# Patient Record
Sex: Male | Born: 1955 | Race: White | Hispanic: No | Marital: Married | Smoking: Former smoker
Health system: Southern US, Community
[De-identification: ages and names within clinical notes are randomized; demographics above are authoritative.]

## PROBLEM LIST (undated history)

## (undated) DIAGNOSIS — G5601 Carpal tunnel syndrome, right upper limb: Secondary | ICD-10-CM

## (undated) DIAGNOSIS — I82409 Acute embolism and thrombosis of unspecified deep veins of unspecified lower extremity: Secondary | ICD-10-CM

## (undated) DIAGNOSIS — M797 Fibromyalgia: Secondary | ICD-10-CM

## (undated) DIAGNOSIS — I2699 Other pulmonary embolism without acute cor pulmonale: Secondary | ICD-10-CM

## (undated) DIAGNOSIS — Z87442 Personal history of urinary calculi: Secondary | ICD-10-CM

## (undated) DIAGNOSIS — K219 Gastro-esophageal reflux disease without esophagitis: Secondary | ICD-10-CM

## (undated) DIAGNOSIS — J189 Pneumonia, unspecified organism: Secondary | ICD-10-CM

## (undated) DIAGNOSIS — G4700 Insomnia, unspecified: Secondary | ICD-10-CM

## (undated) DIAGNOSIS — K589 Irritable bowel syndrome without diarrhea: Secondary | ICD-10-CM

## (undated) DIAGNOSIS — Z8719 Personal history of other diseases of the digestive system: Secondary | ICD-10-CM

## (undated) DIAGNOSIS — G43909 Migraine, unspecified, not intractable, without status migrainosus: Secondary | ICD-10-CM

## (undated) HISTORY — PX: KNEE SURGERY: SHX244

## (undated) HISTORY — PX: LITHOTRIPSY: SUR834

## (undated) HISTORY — PX: BACK SURGERY: SHX140

## (undated) HISTORY — PX: CARPAL TUNNEL RELEASE: SHX101

## (undated) HISTORY — PX: FOREARM SURGERY: SHX651

---

## 2000-09-01 ENCOUNTER — Encounter: Payer: Self-pay | Admitting: Emergency Medicine

## 2000-09-01 ENCOUNTER — Emergency Department (HOSPITAL_COMMUNITY): Admission: EM | Admit: 2000-09-01 | Discharge: 2000-09-01 | Payer: Self-pay | Admitting: Emergency Medicine

## 2000-10-02 ENCOUNTER — Encounter: Payer: Self-pay | Admitting: Urology

## 2000-10-02 ENCOUNTER — Encounter: Admission: RE | Admit: 2000-10-02 | Discharge: 2000-10-02 | Payer: Self-pay | Admitting: Urology

## 2004-08-11 ENCOUNTER — Emergency Department (HOSPITAL_COMMUNITY): Admission: EM | Admit: 2004-08-11 | Discharge: 2004-08-11 | Payer: Self-pay | Admitting: Emergency Medicine

## 2004-08-20 ENCOUNTER — Emergency Department (HOSPITAL_COMMUNITY): Admission: EM | Admit: 2004-08-20 | Discharge: 2004-08-20 | Payer: Self-pay | Admitting: *Deleted

## 2005-12-14 ENCOUNTER — Emergency Department (HOSPITAL_COMMUNITY): Admission: EM | Admit: 2005-12-14 | Discharge: 2005-12-14 | Payer: Self-pay | Admitting: Emergency Medicine

## 2006-01-23 ENCOUNTER — Encounter: Admission: RE | Admit: 2006-01-23 | Discharge: 2006-01-23 | Payer: Self-pay | Admitting: Gastroenterology

## 2006-12-03 IMAGING — NM NM HEPATO W/GB/PHARM/[PERSON_NAME]
5 series · 10 of 10 positions shown · non-contrast
Comparison: None.

CLINICAL DATA: Upper abdominal pain. 
 NUCLEAR MEDICINE HEPATOBILIARY SCAN WITH EJECTION FRACTION:
TECHNIQUE: Sequential abdominal images were obtained following intravenous injection of radiopharmaceutical.  Sequential images were continued following oral ingestion of 8 oz. half-and-half, and the gallbladder ejection fraction was calculated.
 Radiopharmaceutical:  5.1 mCi Tc 99m Choletec

[gb hepatobiliary · 1 of 1 slices shown (1 of 5)]
[im 1/1]
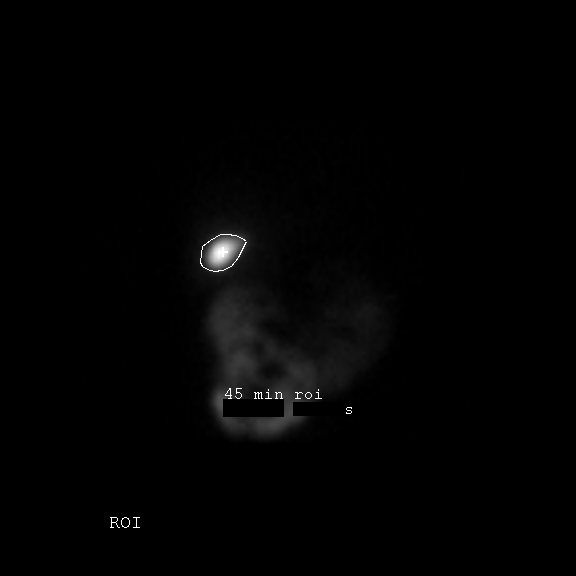

[gb hepatobiliary · 1 of 1 slices shown (2 of 5)]
[im 1/1]
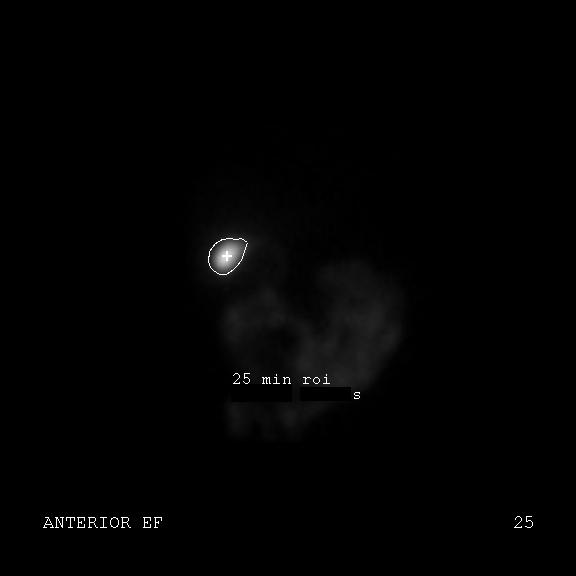

[gb hepatobiliary · 1 of 1 slices shown (3 of 5)]
[im 1/1]
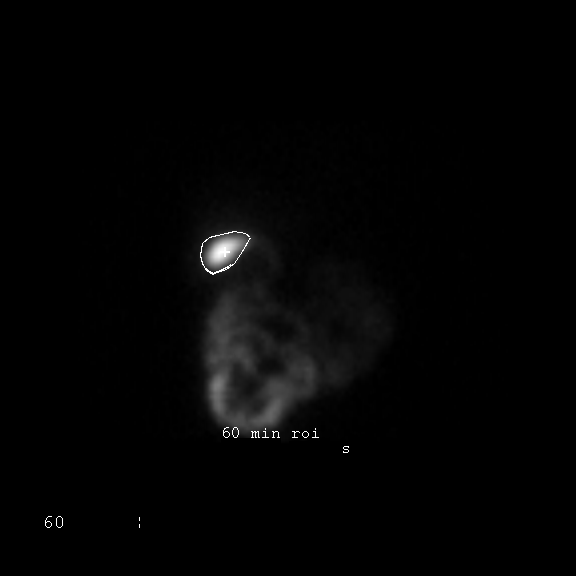

[gb hepatobiliary · 4.66mm/px · 6 of 12 frames shown (4 of 5)]
[frame 2/12]
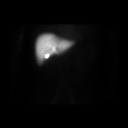
[frame 4/12]
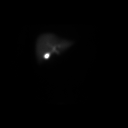
[frame 6/12]
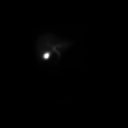
[frame 8/12]
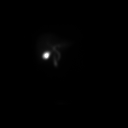
[frame 10/12]
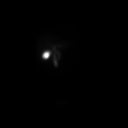
[frame 12/12]
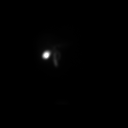

[gb hepatobiliary · 1 of 1 slices shown (5 of 5)]
[im 1/1]
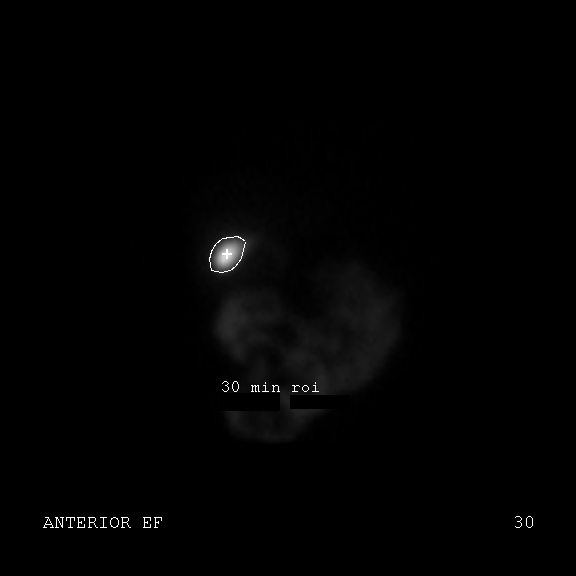

[10 of 10 positions shown; findings below may reference images not displayed]

FINDINGS: There is brisk uptake of radiotracer by the hepatic parenchyma with blood pool activity almost cleared by the five minute film.  Common duct activity and gallbladder activity is present on the 10 minute film.  Gut activity is seen by 30 minutes.  
 Region of interest was drawn around the gallbladder at 60 minutes and the patient was subsequently given 8 ounces of oral Half & Half.  The corrections were  calculated.  The residual activity within the gallbladder was calculated at 60 minutes.  Gallbladder ejection fraction at 60 minutes was calculated at 52%.
IMPRESSION: 1.  Normal hepatobiliary scan.
 2.  Normal gallbladder ejection fraction.

## 2020-04-25 ENCOUNTER — Other Ambulatory Visit: Payer: Self-pay

## 2020-04-25 ENCOUNTER — Emergency Department (INDEPENDENT_AMBULATORY_CARE_PROVIDER_SITE_OTHER)
Admission: EM | Admit: 2020-04-25 | Discharge: 2020-04-25 | Disposition: A | Discharge status: Home / Self Care | Payer: BC Managed Care – PPO | Source: Home / Self Care

## 2020-04-25 ENCOUNTER — Emergency Department: Payer: BC Managed Care – PPO

## 2020-04-25 DIAGNOSIS — I82411 Acute embolism and thrombosis of right femoral vein: Secondary | ICD-10-CM

## 2020-04-25 DIAGNOSIS — I82431 Acute embolism and thrombosis of right popliteal vein: Secondary | ICD-10-CM

## 2020-04-25 DIAGNOSIS — Z87891 Personal history of nicotine dependence: Secondary | ICD-10-CM | POA: Diagnosis not present

## 2020-04-25 DIAGNOSIS — E876 Hypokalemia: Secondary | ICD-10-CM | POA: Diagnosis not present

## 2020-04-25 DIAGNOSIS — I824Z1 Acute embolism and thrombosis of unspecified deep veins of right distal lower extremity: Secondary | ICD-10-CM | POA: Diagnosis not present

## 2020-04-25 DIAGNOSIS — I2694 Multiple subsegmental pulmonary emboli without acute cor pulmonale: Secondary | ICD-10-CM | POA: Diagnosis not present

## 2020-04-25 DIAGNOSIS — I82441 Acute embolism and thrombosis of right tibial vein: Secondary | ICD-10-CM

## 2020-04-25 DIAGNOSIS — I2699 Other pulmonary embolism without acute cor pulmonale: Secondary | ICD-10-CM | POA: Diagnosis not present

## 2020-04-25 DIAGNOSIS — Z20822 Contact with and (suspected) exposure to covid-19: Secondary | ICD-10-CM | POA: Diagnosis not present

## 2020-04-25 DIAGNOSIS — I82451 Acute embolism and thrombosis of right peroneal vein: Secondary | ICD-10-CM

## 2020-04-25 DIAGNOSIS — Z79899 Other long term (current) drug therapy: Secondary | ICD-10-CM | POA: Diagnosis not present

## 2020-04-25 DIAGNOSIS — G43909 Migraine, unspecified, not intractable, without status migrainosus: Secondary | ICD-10-CM | POA: Diagnosis not present

## 2020-04-25 DIAGNOSIS — M7989 Other specified soft tissue disorders: Secondary | ICD-10-CM | POA: Diagnosis not present

## 2020-04-25 DIAGNOSIS — I10 Essential (primary) hypertension: Secondary | ICD-10-CM | POA: Diagnosis not present

## 2020-04-25 DIAGNOSIS — G4700 Insomnia, unspecified: Secondary | ICD-10-CM | POA: Diagnosis not present

## 2020-04-25 DIAGNOSIS — I493 Ventricular premature depolarization: Secondary | ICD-10-CM | POA: Diagnosis not present

## 2020-04-25 NOTE — ED Triage Notes (Signed)
Patient presents to Urgent Care with complaints of right leg swelling and pain since about 5 days ago. Patient reports the pain started behind his knee, swelling started in his calf. Pt denies recent long car rides or flights, is not on blood thinners.

## 2020-04-25 NOTE — ED Notes (Signed)
Patient is being discharged from the Urgent Care and sent to the Emergency Department via POV . Per Denny Peon, [place], patient is in need of higher level of care due to multiple DVTs throughout his right leg. Patient is aware and verbalizes understanding of plan of care.  Vitals:   04/25/20 1235  BP: (!) 154/99  Pulse: 90  Resp: 16  Temp: 97.6 F (36.4 C)  SpO2: 98%

## 2020-04-25 NOTE — Discharge Instructions (Signed)
You have an extensive blood clot in your right leg. It is advised you go to the emergency department immediately for further evaluation and treatment.  You have declined EMS transport. Please have your wife drive you directly to the hospital.  There is risk of the clot in your leg traveling to your heart or lungs, which can serious injury and even death if not treated immediately.

## 2020-04-25 NOTE — ED Provider Notes (Signed)
[facility]    CSN: [id number] Arrival date & time: 04/25/20  1215      History   Chief Complaint Chief Complaint  Patient presents with  . Leg Swelling    Right    HPI Parker Morgan is a 64 y.o. male.   HPI Parker Morgan is a 64 y.o. male presenting to UC with c/o 5 days of gradually worsening Right leg pain and swelling. 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 He initially thought it was a pulled muscle.  Last night pain improved with lying down but became more intense this morning with swelling into his upper thigh. Pain is 10/10, aching and sore, worse with movement. Denies chest pain or SOB. No hx of blood clots. No recent surgery or travel.  He took Tylenol and ibuprofen without relief.   History reviewed. No pertinent past medical history.  There are no problems to display for this patient.   History reviewed. No pertinent surgical history.     Home Medications    Prior to Admission medications   Medication Sig Start Date End Date Taking? Authorizing Provider  zolpidem (AMBIEN) 5 MG tablet Take 5 mg by mouth at bedtime as needed for sleep.   Yes [provider]    Family History Family History  Problem Relation Age of Onset  . Graves' disease Mother   . Healthy Father     Social History Social History   Tobacco Use  . Smoking status: Former Smoker    Types: Cigarettes    Quit date: 07/30/1988    Years since quitting: 31.7  . Smokeless tobacco: Never Used  Vaping Use  . Vaping Use: Never used  Substance Use Topics  . Alcohol use: Yes    Alcohol/week: 5.0 standard drinks    Types: 5 Standard drinks or equivalent per week  . Drug use: Never     Allergies   Patient has no known allergies.   Review of Systems Review of Systems  Constitutional: Negative for chills and fever.  Musculoskeletal: Positive for arthralgias, gait problem, joint swelling and myalgias.  Skin: Negative  for color change and wound.     Physical Exam Triage Vital Signs ED Triage Vitals  Enc Vitals Group     BP 04/25/20 1235 (!) 154/99     Pulse Rate 04/25/20 1235 90     Resp 04/25/20 1235 16     Temp 04/25/20 1235 97.6 F (36.4 C)     Temp Source 04/25/20 1235 Oral     SpO2 04/25/20 1235 98 %     Weight --      Height --      Head Circumference --      Peak Flow --      Pain Score 04/25/20 1232 10     Pain Loc --      Pain Edu? --      Excl. in GC? --    No data found.  Updated Vital Signs BP (!) 154/99 (BP Location: Left Arm)   Pulse 90   Temp 97.6 F (36.4 C) (Oral)   Resp 16   SpO2 98%   Visual Acuity Right Eye Distance:   Left Eye Distance:   Bilateral Distance:    Right Eye Near:   Left Eye Near:    Bilateral Near:     Physical Exam Vitals and nursing note reviewed.  Constitutional:      General:  He is not in acute distress.    Appearance: Normal appearance. He is well-developed. He is not ill-appearing.     Comments: Pt sitting in wheelchair with Right leg stretched out, appears uncomfortable but cooperative during exam.   HENT:     Head: Normocephalic and atraumatic.  Cardiovascular:     Rate and Rhythm: Normal rate and regular rhythm.  Pulmonary:     Effort: Pulmonary effort is normal. No respiratory distress.     Breath sounds: Normal breath sounds.  Musculoskeletal:        General: Swelling and tenderness present.     Cervical back: Normal range of motion.     Comments: Right leg: from thigh to ankle: moderate edema compared to left. Diffuse tenderness of thigh, posterior knee and calf. Increased pain with flexion of hip and knee.   Skin:    General: Skin is warm and dry.     Findings: No bruising or erythema.  Neurological:     Mental Status: He is alert and oriented to person, place, and time.  Psychiatric:        Behavior: Behavior normal.      UC Treatments / Results  Labs (all labs ordered are listed, but only abnormal results are  displayed) Labs Reviewed - No data to display  EKG   Radiology US Venous Img Lower Unilateral Right  Result Date: 04/25/2020 CLINICAL DATA:  Acute right lower extremity pain and swelling. EXAM: Right LOWER EXTREMITY VENOUS DOPPLER ULTRASOUND TECHNIQUE: Gray-scale sonography with graded compression, as well as color Doppler and duplex ultrasound were performed to evaluate the lower extremity deep venous systems from the level of the common femoral vein and including the common femoral, femoral, profunda femoral, popliteal and calf veins including the posterior tibial, peroneal and gastrocnemius veins when visible. The superficial great saphenous vein was also interrogated. Spectral Doppler was utilized to evaluate flow at rest and with distal augmentation maneuvers in the common femoral, femoral and popliteal veins. COMPARISON:  None. FINDINGS: Contralateral Common Femoral Vein: Respiratory phasicity is normal and symmetric with the symptomatic side. No evidence of thrombus. Normal compressibility. Common Femoral Vein: Only partially compressible with some flow present consistent with thrombus. Saphenofemoral Junction: Partially compressible with some flow present consistent with thrombus. Profunda Femoral Vein: Occlusive thrombus is noted. Femoral Vein: Occlusive thrombus is noted. Popliteal Vein: Occlusive thrombus is noted. Calf Veins: Occlusive thrombus is noted. Superficial Great Saphenous Vein: No evidence of thrombus. Normal compressibility. Venous Reflux:  None. Other Findings:  None. IMPRESSION: Acute deep venous thrombosis is seen in the right common femoral, profunda femoral, superficial femoral, popliteal, posterior tibial and peroneal veins. These results will be called to the ordering clinician or representative by the Radiologist Assistant, and communication documented in the PACS or zVision Dashboard. Electronically Signed   By: Lupita Raider M.D.   On: 04/25/2020 13:29     Procedures Procedures (including critical care time)  Medications Ordered in UC Medications - No data to display  Initial Impression / Assessment and Plan / UC Course  I have reviewed the triage vital signs and the nursing notes.  Pertinent labs & imaging results that were available during my care of the patient were reviewed by me and considered in my medical decision making (see chart for details).    Hx and exam concerning for DVT.  Discussed imaging with pt Pt understanding of severity of blood clot and risk of complication and death. Pt declined EMS transport but is agreeable to  have his wife drive him to [facility] for further evaluation and treatment. AVS given  Final Clinical Impressions(s) / UC Diagnoses   Final diagnoses:  Acute deep vein thrombosis (DVT) of femoral vein of right lower extremity (HCC)  Acute deep vein thrombosis (DVT) of popliteal vein of right lower extremity (HCC)  Acute deep vein thrombosis (DVT) of right tibial vein (HCC)  Acute deep vein thrombosis (DVT) of right peroneal vein [facility])     Discharge Instructions     You have an extensive blood clot in your right leg. It is advised you go to the emergency department immediately for further evaluation and treatment.  You have declined EMS transport. Please have your wife drive you directly to the hospital.  There is risk of the clot in your leg traveling to your heart or lungs, which can serious injury and even death if not treated immediately.     ED Prescriptions    None     PDMP not reviewed this encounter.   Lurene Shadow, [place] 04/25/20 1409

## 2020-04-26 DIAGNOSIS — I071 Rheumatic tricuspid insufficiency: Secondary | ICD-10-CM | POA: Diagnosis not present

## 2020-05-03 DIAGNOSIS — I2699 Other pulmonary embolism without acute cor pulmonale: Secondary | ICD-10-CM | POA: Diagnosis not present

## 2020-05-03 DIAGNOSIS — G4700 Insomnia, unspecified: Secondary | ICD-10-CM | POA: Diagnosis not present

## 2020-05-03 DIAGNOSIS — G43001 Migraine without aura, not intractable, with status migrainosus: Secondary | ICD-10-CM | POA: Diagnosis not present

## 2020-05-03 DIAGNOSIS — I10 Essential (primary) hypertension: Secondary | ICD-10-CM | POA: Diagnosis not present

## 2020-05-03 DIAGNOSIS — Z79899 Other long term (current) drug therapy: Secondary | ICD-10-CM | POA: Diagnosis not present

## 2020-05-03 DIAGNOSIS — I82411 Acute embolism and thrombosis of right femoral vein: Secondary | ICD-10-CM | POA: Diagnosis not present

## 2020-05-16 DIAGNOSIS — I82411 Acute embolism and thrombosis of right femoral vein: Secondary | ICD-10-CM | POA: Diagnosis not present

## 2020-05-16 DIAGNOSIS — Z7901 Long term (current) use of anticoagulants: Secondary | ICD-10-CM | POA: Diagnosis not present

## 2020-05-16 DIAGNOSIS — I2699 Other pulmonary embolism without acute cor pulmonale: Secondary | ICD-10-CM | POA: Diagnosis not present

## 2020-06-22 DIAGNOSIS — Z86711 Personal history of pulmonary embolism: Secondary | ICD-10-CM | POA: Diagnosis not present

## 2020-06-22 DIAGNOSIS — D6859 Other primary thrombophilia: Secondary | ICD-10-CM | POA: Diagnosis not present

## 2020-06-22 DIAGNOSIS — F5101 Primary insomnia: Secondary | ICD-10-CM | POA: Diagnosis not present

## 2020-06-22 DIAGNOSIS — Z79899 Other long term (current) drug therapy: Secondary | ICD-10-CM | POA: Diagnosis not present

## 2020-06-22 DIAGNOSIS — Z86718 Personal history of other venous thrombosis and embolism: Secondary | ICD-10-CM | POA: Diagnosis not present

## 2020-09-29 ENCOUNTER — Other Ambulatory Visit: Payer: Self-pay

## 2020-09-29 ENCOUNTER — Encounter: Payer: Self-pay | Admitting: *Deleted

## 2020-09-29 ENCOUNTER — Emergency Department (INDEPENDENT_AMBULATORY_CARE_PROVIDER_SITE_OTHER)
Admission: EM | Admit: 2020-09-29 | Discharge: 2020-09-29 | Disposition: A | Discharge status: Home / Self Care | Payer: BC Managed Care – PPO | Source: Home / Self Care

## 2020-09-29 DIAGNOSIS — K219 Gastro-esophageal reflux disease without esophagitis: Secondary | ICD-10-CM | POA: Diagnosis not present

## 2020-09-29 DIAGNOSIS — R112 Nausea with vomiting, unspecified: Secondary | ICD-10-CM | POA: Diagnosis not present

## 2020-09-29 DIAGNOSIS — I2699 Other pulmonary embolism without acute cor pulmonale: Secondary | ICD-10-CM | POA: Diagnosis not present

## 2020-09-29 DIAGNOSIS — R066 Hiccough: Secondary | ICD-10-CM

## 2020-09-29 HISTORY — DX: Insomnia, unspecified: G47.00

## 2020-09-29 MED ORDER — OMEPRAZOLE 40 MG PO CPDR: 40.0000 mg | DELAYED_RELEASE_CAPSULE | Freq: Every day | ORAL | 0 refills | Status: DC

## 2020-09-29 MED ORDER — GABAPENTIN 300 MG PO CAPS: 300.0000 mg | ORAL_CAPSULE | Freq: Three times a day (TID) | ORAL | 0 refills | Status: DC

## 2020-09-29 MED ORDER — ONDANSETRON 8 MG PO TBDP: 8.0000 mg | ORAL_TABLET | Freq: Three times a day (TID) | ORAL | 0 refills | Status: DC | PRN

## 2020-09-29 NOTE — Discharge Instructions (Addendum)
Take zofran in the morning It will help with nausea and vomiting Take the gabapentin to stop;hiccups Take the omeprazole every day.  This will reduce the stomach acid and heartburn CALL your doctor at [facility] physicians.  I do not believe you should have stopped the eliquis. Go to your scan on the 10th GO TO ER if you get worse

## 2020-09-29 NOTE — ED Triage Notes (Addendum)
Patient and wife report continuous hiccups x 3 days. Only 1 rest period of 1 hour while sleeping. He has been taking Ambien and drinking alcohol to try to sleep. He is unsteady on his feet and very fatigued. C/o heartburn and nausea. He has tried multiple home remedies without resolve to stop the hiccups. He currently has a scan of his abdomen ordered for 3/10 to evaluate hernias, decreased appetite, nausea and reflux.

## 2020-09-29 NOTE — ED Provider Notes (Signed)
[facility]    CSN: [id number] Arrival date & time: 09/29/20  9794      History   Chief Complaint Chief Complaint  Patient presents with  . Hiccups    HPI JAIVEON Morgan is a 65 y.o. male.   HPI   Patient is here for intractable hiccups.  Present for 2 days. his. history, however, is much more complicated than that.  He states that he has been having abdominal discomfort for 2 weeks.  That he is only been able to keep down one meal a day.  He is having protracted vomiting.  He is having significant acid reflux.  He states he has "3 hernias" in his abdomen.  His wife feels like he has lost weight.  He is not on any medication for acid reflux although he states he does have a history of this. Patient also had a DVT with bilateral pulmonary emboli in September of this year.  He was placed on Eliquis.  He states that he has stopped his Eliquis, but did not do this under the guidance of his physician.  He does see [facility] and has been there within the last 3 months.  He states he has a scan of his abdomen scheduled for 10/07/2019.  I reviewed his notes from [facility], was unable to find an order for abdominal testing although there is a mention of getting a prior approval for an MRI of his brain, diagnosis unclear. our records are included not included he has polymyalgia rheumatica, acid reflux, bilateral pulmonary emboli/hypercoagulable state, osteoarthritis, anxiety, insomnia   Past Medical History:  Diagnosis Date  . Insomnia     There are no problems to display for this patient.   Past Surgical History:  Procedure Laterality Date  . BACK SURGERY    . CARPAL TUNNEL RELEASE         Home Medications    Prior to Admission medications   Medication Sig Start Date End Date Taking? Authorizing Provider  gabapentin (NEURONTIN) 300 MG capsule Take 1 capsule (300 mg total) by mouth 3 (three) times daily. For hiccups 09/29/20  Yes Eustace Moore, MD   omeprazole (PRILOSEC) 40 MG capsule Take 1 capsule (40 mg total) by mouth daily. 09/29/20  Yes Eustace Moore, MD  ondansetron (ZOFRAN ODT) 8 MG disintegrating tablet Take 1 tablet (8 mg total) by mouth every 8 (eight) hours as needed for nausea or vomiting. 09/29/20  Yes Eustace Moore, MD  zolpidem (AMBIEN) 5 MG tablet Take 5 mg by mouth at bedtime as needed for sleep.    [provider]    Family History Family History  Problem Relation Age of Onset  . Graves' disease Mother   . Healthy Father     Social History Social History   Tobacco Use  . Smoking status: Former Smoker    Types: Cigarettes    Quit date: 07/30/1988    Years since quitting: 32.1  . Smokeless tobacco: Never Used  Vaping Use  . Vaping Use: Never used  Substance Use Topics  . Alcohol use: Yes    Alcohol/week: 5.0 standard drinks    Types: 5 Standard drinks or equivalent per week  . Drug use: Never     Allergies   Patient has no known allergies.   Review of Systems Review of Systems See HPI  Physical Exam Triage Vital Signs ED Triage Vitals  Enc Vitals Group     BP 09/29/20 0849 (!) 161/90  Pulse Rate 09/29/20 0849 86     Resp 09/29/20 0849 16     Temp 09/29/20 0849 98.3 F (36.8 C)     Temp Source 09/29/20 0849 Oral     SpO2 09/29/20 0849 97 %     Weight --      Height --      Head Circumference --      Peak Flow --      Pain Score 09/29/20 0854 2     Pain Loc --      Pain Edu? --      Excl. in GC? --    No data found.  Updated Vital Signs BP (!) 161/90 (BP Location: Left Arm)   Pulse 86   Temp 98.3 F (36.8 C) (Oral)   Resp 16   SpO2 97%      Physical Exam Constitutional:      General: He is not in acute distress.    Appearance: He is well-developed and well-nourished.     Comments: Patient appears.  He is slow to answer questions.  His wife states it is because he took his Ambien CR too late last night and has not yet fully awakened  HENT:     Head:  Normocephalic and atraumatic.     Nose:     Comments: Mask is in place.  Mucous membranes are moist    Mouth/Throat:     Mouth: Oropharynx is clear and moist.  Eyes:     Conjunctiva/sclera: Conjunctivae normal.     Pupils: Pupils are equal, round, and reactive to light.  Cardiovascular:     Rate and Rhythm: Normal rate and regular rhythm.     Heart sounds: Normal heart sounds.  Pulmonary:     Effort: Pulmonary effort is normal. No respiratory distress.     Breath sounds: Normal breath sounds.  Abdominal:     General: There is no distension.     Palpations: Abdomen is soft.     Comments: No tenderness palpation of abdomen.  No hiccups observed  Musculoskeletal:        General: No edema. Normal range of motion.     Cervical back: Normal range of motion.  Skin:    General: Skin is warm and dry.  Neurological:     Mental Status: He is alert.      UC Treatments / Results  Labs (all labs ordered are listed, but only abnormal results are displayed) Labs Reviewed - No data to display  EKG   Radiology No results found.  Procedures Procedures (including critical care time)  Medications Ordered in UC Medications - No data to display  Initial Impression / Assessment and Plan / UC Course  I have reviewed the triage vital signs and the nursing notes.  Pertinent labs & imaging results that were available during my care of the patient were reviewed by me and considered in my medical decision making (see chart for details).     Although I believe this patient has something going on in his gastrointestinal tract, I am uncertain his actual diagnosis.  He is not acute.  He is well-hydrated.  He has no tenderness on abdominal exam.  Going to give him Zofran for his history of nausea.  I am going to give him omeprazole for his history of GERD.  I am also going to give him gabapentin in case he has additional hiccups.  He is told to call his [facility] to secure a follow-up  appointment for next week Final Clinical Impressions(s) / UC Diagnoses   Final diagnoses:  Gastroesophageal reflux disease, unspecified whether esophagitis present  Intractable hiccups  Intractable vomiting with nausea, unspecified vomiting type  Pulmonary embolism without acute cor pulmonale, unspecified chronicity, unspecified pulmonary embolism type [facility])     Discharge Instructions     Take zofran in the morning It will help with nausea and vomiting Take the gabapentin to stop;hiccups Take the omeprazole every day.  This will reduce the stomach acid and heartburn CALL your doctor at [facility].  I do not believe you should have stopped the eliquis. Go to your scan on the 10th GO TO ER if you get worse   ED Prescriptions    Medication Sig Dispense Auth. Provider   gabapentin (NEURONTIN) 300 MG capsule Take 1 capsule (300 mg total) by mouth 3 (three) times daily. For hiccups 30 capsule Eustace Moore, MD   omeprazole (PRILOSEC) 40 MG capsule Take 1 capsule (40 mg total) by mouth daily. 30 capsule Eustace Moore, MD   ondansetron (ZOFRAN ODT) 8 MG disintegrating tablet Take 1 tablet (8 mg total) by mouth every 8 (eight) hours as needed for nausea or vomiting. 20 tablet Eustace Moore, MD     PDMP not reviewed this encounter.   Eustace Moore, MD 09/29/20 1016

## 2020-10-06 ENCOUNTER — Ambulatory Visit: Payer: Self-pay | Admitting: Surgery

## 2020-10-06 DIAGNOSIS — K4020 Bilateral inguinal hernia, without obstruction or gangrene, not specified as recurrent: Secondary | ICD-10-CM | POA: Diagnosis not present

## 2020-10-06 DIAGNOSIS — K429 Umbilical hernia without obstruction or gangrene: Secondary | ICD-10-CM | POA: Diagnosis not present

## 2020-10-06 NOTE — H&P (Signed)
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 He had been living in [place] until last year and developed left groin pain and a reducible mass. He did meet with the surgeon who diagnosed him with bilateral inguinal hernias as well as an umbilical hernia repair was postponed due to covid. He has since relocated here and is in the process of reestablishing care with primary care providers in specialists. He notes that the pain is significant and worsening, he is now also having pain on the right side which is burning and aching quality. This is worse at the end of a long day of standing, worst after walking or physical activity. 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 His family however staged intervention a few weeks ago and he is now off of this. He walks about 30 minutes with this wife each day. He denies any associated bowel or bladder issues but does endorse a history of chronic diarrhea for which he takes Lomotil. Denies any blood in his stool or urine. Does endorse some appetite change but attributes this to his recently halted alcohol abuse. Denies any prior abdominal or groin surgery.    No Known Allergies  Past Medical History:  Diagnosis Date  . Insomnia     Past Surgical History:  Procedure Laterality Date  . BACK SURGERY    . CARPAL TUNNEL RELEASE      Family History  Problem Relation Age of Onset  . Graves' disease Mother   . Healthy Father     Social History   Socioeconomic History  . Marital status: Married    Spouse name: Not on file  . Number of children: Not on file  . Years of education: Not on file  . Highest education level: Not on file   Occupational History  . Not on file  Tobacco Use  . Smoking status: Former Smoker    Types: Cigarettes    Quit date: 07/30/1988    Years since quitting: 32.2  . Smokeless tobacco: Never Used  Vaping Use  . Vaping Use: Never used  Substance and Sexual Activity  . Alcohol use: Yes    Alcohol/week: 5.0 standard drinks    Types: 5 Standard drinks or equivalent per week  . Drug use: Never  . Sexual activity: Not on file  Other Topics Concern  . Not on file  Social History Narrative  . Not on file   Social Determinants of Health   Financial Resource Strain: Not on file  Food Insecurity: Not on file  Transportation Needs: Not on file  Physical Activity: Not on file  Stress: Not on file  Social Connections: Not on file    Current Outpatient Medications on File Prior to Visit  Medication Sig Dispense Refill  . gabapentin (NEURONTIN) 300 MG capsule Take 1 capsule (300 mg total) by mouth 3 (three) times daily. For hiccups 30 capsule 0  . omeprazole (PRILOSEC) 40 MG capsule Take 1 capsule (40 mg total) by mouth daily. 30 capsule 0  . ondansetron (ZOFRAN ODT) 8 MG disintegrating tablet Take 1 tablet (8 mg total) by mouth every 8 (eight) hours as needed for nausea or vomiting. 20 tablet 0  .  zolpidem (AMBIEN) 5 MG tablet Take 5 mg by mouth at bedtime as needed for sleep.     No current facility-administered medications on file prior to visit.    Review of Systems: General Present- Appetite Loss. Not Present- Chills, Fatigue, Fever, Night Sweats, Weight Gain and Weight Loss. Skin Present- Dryness. Not Present- Change in Wart/Mole, Hives, Jaundice, New Lesions, Non-Healing Wounds, Rash and Ulcer. HEENT Present- Wears glasses/contact lenses. Not Present- Earache, Hearing Loss, Hoarseness, Nose Bleed, Oral Ulcers, Ringing in the Ears, Seasonal Allergies, Sinus Pain, Sore Throat, Visual Disturbances and Yellow Eyes. Respiratory Not Present- Bloody sputum, Chronic Cough, Difficulty  Breathing, Snoring and Wheezing. Breast Not Present- Breast Mass, Breast Pain, Nipple Discharge and Skin Changes. Cardiovascular Not Present- Chest Pain, Difficulty Breathing Lying Down, Leg Cramps, Palpitations, Rapid Heart Rate, Shortness of Breath and Swelling of Extremities. Gastrointestinal Present- Abdominal Pain, Bloating, Chronic diarrhea and Nausea. Not Present- Bloody Stool, Change in Bowel Habits, Constipation, Difficulty Swallowing, Excessive gas, Gets full quickly at meals, Hemorrhoids, Indigestion, Rectal Pain and Vomiting. Male Genitourinary Not Present- Blood in Urine, Change in Urinary Stream, Frequency, Impotence, Nocturia, Painful Urination, Urgency and Urine Leakage. Musculoskeletal Present- Joint Pain. Not Present- Back Pain, Joint Stiffness, Muscle Pain, Muscle Weakness and Swelling of Extremities. Neurological Not Present- Decreased Memory, Fainting, Headaches, Numbness, Seizures, Tingling, Tremor, Trouble walking and Weakness. Psychiatric Not Present- Anxiety, Bipolar, Change in Sleep Pattern, Depression, Fearful and Frequent crying. Endocrine Not Present- Cold Intolerance, Excessive Hunger, Hair Changes, Heat Intolerance, Hot flashes and New Diabetes. Hematology Present- Easy Bruising. Not Present- Blood Thinners, Excessive bleeding, Gland problems, HIV and Persistent Infections. All other systems negative  Physical Exam: Vitals  Weight: 159.25 lb Height: 67in Body Surface Area: 1.84 m Body Mass Index: 24.94 kg/m  Temp.: 98.32F  Pulse: 93 (Regular)  P.OX: 100% (Room air) BP: 158/96(Sitting, Left Arm, Standard)  Alert, well-appearing, unlabored respirations Abdomen is soft and nontender. There is a reducible subcentimeter umbilical hernia. Bilateral inguinal hernias, left greater than right.    No flowsheet data found.  No flowsheet data found.  No results found for: INR, PROTIME  Imaging: No results found.   A/P: BILATERAL INGUINAL HERNIA  (K40.20) Story: Increasingly painful, I discussed the relevant anatomy and pathology with him and went over the options for repair including open and robotic/arthroscopic approach. For this bilateral disease or recommend a robotic approach. We discussed the technique, risks of bleeding, infection, pain, scarring, injury to intra-abdominal or retroperitoneal structures, ongoing pain, hernia recurrence, as well as systemic issues such as recurrent DVT/PE, or other cardiovascular/pulmonary complications. Documentation from his hematologist in [facility] in September states that 3 months of continuous therapy were necessary but after that it should be fine to hold eliquis for surgery Questions were welcomed and answered. He wishes to proceed with scheduling.   UMBILICAL HERNIA (K42.9) Story: Subcentimeter, will plan to use as one of the trocar sites and repair at the time of inguinal hernia surgery.    There are no problems to display for this patient.      Phylliss Blakes, MD [facility], [place]  See AMION to contact appropriate on-call provider

## 2020-10-06 NOTE — H&P (View-Only) (Signed)
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 He had been living in [place] until last year and developed left groin pain and a reducible mass. He did meet with the surgeon who diagnosed him with bilateral inguinal hernias as well as an umbilical hernia repair was postponed due to covid. He has since relocated here and is in the process of reestablishing care with primary care providers in specialists. He notes that the pain is significant and worsening, he is now also having pain on the right side which is burning and aching quality. This is worse at the end of a long day of standing, worst after walking or physical activity. 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 His family however staged intervention a few weeks ago and he is now off of this. He walks about 30 minutes with this wife each day. He denies any associated bowel or bladder issues but does endorse a history of chronic diarrhea for which he takes Lomotil. Denies any blood in his stool or urine. Does endorse some appetite change but attributes this to his recently halted alcohol abuse. Denies any prior abdominal or groin surgery.    No Known Allergies  Past Medical History:  Diagnosis Date  . Insomnia     Past Surgical History:  Procedure Laterality Date  . BACK SURGERY    . CARPAL TUNNEL RELEASE      Family History  Problem Relation Age of Onset  . Graves' disease Mother   . Healthy Father     Social History   Socioeconomic History  . Marital status: Married    Spouse name: Not on file  . Number of children: Not on file  . Years of education: Not on file  . Highest education level: Not on file   Occupational History  . Not on file  Tobacco Use  . Smoking status: Former Smoker    Types: Cigarettes    Quit date: 07/30/1988    Years since quitting: 32.2  . Smokeless tobacco: Never Used  Vaping Use  . Vaping Use: Never used  Substance and Sexual Activity  . Alcohol use: Yes    Alcohol/week: 5.0 standard drinks    Types: 5 Standard drinks or equivalent per week  . Drug use: Never  . Sexual activity: Not on file  Other Topics Concern  . Not on file  Social History Narrative  . Not on file   Social Determinants of Health   Financial Resource Strain: Not on file  Food Insecurity: Not on file  Transportation Needs: Not on file  Physical Activity: Not on file  Stress: Not on file  Social Connections: Not on file    Current Outpatient Medications on File Prior to Visit  Medication Sig Dispense Refill  . gabapentin (NEURONTIN) 300 MG capsule Take 1 capsule (300 mg total) by mouth 3 (three) times daily. For hiccups 30 capsule 0  . omeprazole (PRILOSEC) 40 MG capsule Take 1 capsule (40 mg total) by mouth daily. 30 capsule 0  . ondansetron (ZOFRAN ODT) 8 MG disintegrating tablet Take 1 tablet (8 mg total) by mouth every 8 (eight) hours as needed for nausea or vomiting. 20 tablet 0  .   zolpidem (AMBIEN) 5 MG tablet Take 5 mg by mouth at bedtime as needed for sleep.     No current facility-administered medications on file prior to visit.    Review of Systems: General Present- Appetite Loss. Not Present- Chills, Fatigue, Fever, Night Sweats, Weight Gain and Weight Loss. Skin Present- Dryness. Not Present- Change in Wart/Mole, Hives, Jaundice, New Lesions, Non-Healing Wounds, Rash and Ulcer. HEENT Present- Wears glasses/contact lenses. Not Present- Earache, Hearing Loss, Hoarseness, Nose Bleed, Oral Ulcers, Ringing in the Ears, Seasonal Allergies, Sinus Pain, Sore Throat, Visual Disturbances and Yellow Eyes. Respiratory Not Present- Bloody sputum, Chronic Cough, Difficulty  Breathing, Snoring and Wheezing. Breast Not Present- Breast Mass, Breast Pain, Nipple Discharge and Skin Changes. Cardiovascular Not Present- Chest Pain, Difficulty Breathing Lying Down, Leg Cramps, Palpitations, Rapid Heart Rate, Shortness of Breath and Swelling of Extremities. Gastrointestinal Present- Abdominal Pain, Bloating, Chronic diarrhea and Nausea. Not Present- Bloody Stool, Change in Bowel Habits, Constipation, Difficulty Swallowing, Excessive gas, Gets full quickly at meals, Hemorrhoids, Indigestion, Rectal Pain and Vomiting. Male Genitourinary Not Present- Blood in Urine, Change in Urinary Stream, Frequency, Impotence, Nocturia, Painful Urination, Urgency and Urine Leakage. Musculoskeletal Present- Joint Pain. Not Present- Back Pain, Joint Stiffness, Muscle Pain, Muscle Weakness and Swelling of Extremities. Neurological Not Present- Decreased Memory, Fainting, Headaches, Numbness, Seizures, Tingling, Tremor, Trouble walking and Weakness. Psychiatric Not Present- Anxiety, Bipolar, Change in Sleep Pattern, Depression, Fearful and Frequent crying. Endocrine Not Present- Cold Intolerance, Excessive Hunger, Hair Changes, Heat Intolerance, Hot flashes and New Diabetes. Hematology Present- Easy Bruising. Not Present- Blood Thinners, Excessive bleeding, Gland problems, HIV and Persistent Infections. All other systems negative  Physical Exam: Vitals  Weight: 159.25 lb Height: 67in Body Surface Area: 1.84 m Body Mass Index: 24.94 kg/m  Temp.: 98.32F  Pulse: 93 (Regular)  P.OX: 100% (Room air) BP: 158/96(Sitting, Left Arm, Standard)  Alert, well-appearing, unlabored respirations Abdomen is soft and nontender. There is a reducible subcentimeter umbilical hernia. Bilateral inguinal hernias, left greater than right.    No flowsheet data found.  No flowsheet data found.  No results found for: INR, PROTIME  Imaging: No results found.   A/P: BILATERAL INGUINAL HERNIA  (K40.20) Story: Increasingly painful, I discussed the relevant anatomy and pathology with him and went over the options for repair including open and robotic/arthroscopic approach. For this bilateral disease or recommend a robotic approach. We discussed the technique, risks of bleeding, infection, pain, scarring, injury to intra-abdominal or retroperitoneal structures, ongoing pain, hernia recurrence, as well as systemic issues such as recurrent DVT/PE, or other cardiovascular/pulmonary complications. Documentation from his hematologist in [facility] in September states that 3 months of continuous therapy were necessary but after that it should be fine to hold eliquis for surgery Questions were welcomed and answered. He wishes to proceed with scheduling.   UMBILICAL HERNIA (K42.9) Story: Subcentimeter, will plan to use as one of the trocar sites and repair at the time of inguinal hernia surgery.    There are no problems to display for this patient.      Phylliss Blakes, MD [facility], [place]  See AMION to contact appropriate on-call provider

## 2020-10-20 ENCOUNTER — Encounter (HOSPITAL_COMMUNITY): Payer: Self-pay | Admitting: Surgery

## 2020-10-20 ENCOUNTER — Other Ambulatory Visit: Payer: Self-pay

## 2020-10-20 NOTE — Patient Instructions (Signed)
DUE TO COVID-19 ONLY ONE VISITOR IS ALLOWED TO COME WITH YOU AND STAY IN THE WAITING ROOM ONLY DURING PRE OP AND PROCEDURE.    COVID SWAB TESTING COMPLETED ON: Today at 10:25 AM  (Must self quarantine after testing. Follow instructions on handout.)       Your procedure is scheduled on: Tuesday, October 25, 2020   Report to [facility] Main  Entrance   Report to Short Stay at 5:30 AM   [facility])   Call this number if you have problems the morning of surgery [phone]   Do not eat food :After Midnight.   May have liquids until 4:30AM   day of surgery  CLEAR LIQUID DIET  Foods Allowed                                                                     Foods Excluded  Water, Black Coffee and tea, regular and decaf                             liquids that you cannot  Plain Jell-O in any flavor  (No red)                                           see through such as: Fruit ices (not with fruit pulp)                                     milk, soups, orange juice              Iced Popsicles (No red)                                    All solid food                                   Apple juices Sports drinks like Gatorade (No red) Lightly seasoned clear broth or consume(fat free) Sugar, honey syrup  Sample Menu Breakfast                                Lunch                                     Supper Cranberry juice                    Beef broth                            Chicken broth Jell-O  Grape juice                           Apple juice Coffee or tea                        Jell-O                                      Popsicle                                                Coffee or tea                        Coffee or tea      Oral Hygiene is also important to reduce your risk of infection.                                    Remember - BRUSH YOUR TEETH THE MORNING OF SURGERY WITH YOUR REGULAR TOOTHPASTE   Do NOT smoke after  Midnight   Take these medicines the morning of surgery with A SIP OF WATER: None                               You may not have any metal on your body including jewelry, and body piercings             Do not wear  lotions, powders, perfumes/cologne, or deodorant                          Men may shave face and neck.   Do not bring valuables to the hospital. [facility] IS NOT             RESPONSIBLE   FOR VALUABLES.   Contacts, dentures or bridgework may not be worn into surgery.    Patients discharged the day of surgery will not be allowed to drive home.   Special Instructions: Bring a copy of your healthcare power of attorney and living will documents         the day of surgery if you haven't scanned them in before.              Please read over the following fact sheets you were given: IF YOU HAVE QUESTIONS ABOUT YOUR PRE OP INSTRUCTIONS PLEASE CALL [phone]   [place] - Preparing for Surgery Before surgery, you can play an important role.  Because skin is not sterile, your skin needs to be as free of germs as possible.  You can reduce the number of germs on your skin by washing with CHG (chlorahexidine gluconate) soap before surgery.  CHG is an antiseptic cleaner which kills germs and bonds with the skin to continue killing germs even after washing. Please DO NOT use if you have an allergy to CHG or antibacterial soaps.  If your skin becomes reddened/irritated stop using the CHG and inform your nurse when you arrive at Short Stay. Do not shave (including legs and underarms) for at  least 48 hours prior to the first CHG shower.  You may shave your face/neck.  Please follow these instructions carefully:  1.  Shower with CHG Soap the night before surgery and the  morning of surgery.  2.  If you choose to wash your hair, wash your hair first as usual with your normal  shampoo.  3.  After you shampoo, rinse your hair and body thoroughly to remove the shampoo.                              4.  Use CHG as you would any other liquid soap.  You can apply chg directly to the skin and wash.  Gently with a scrungie or clean washcloth.  5.  Apply the CHG Soap to your body ONLY FROM THE NECK DOWN.   Do   not use on face/ open                           Wound or open sores. Avoid contact with eyes, ears mouth and   genitals (private parts).                       Wash face,  Genitals (private parts) with your normal soap.             6.  Wash thoroughly, paying special attention to the area where your    surgery  will be performed.  7.  Thoroughly rinse your body with warm water from the neck down.  8.  DO NOT shower/wash with your normal soap after using and rinsing off the CHG Soap.                9.  Pat yourself dry with a clean towel.            10.  Wear clean pajamas.            11.  Place clean sheets on your bed the night of your first shower and do not  sleep with pets. Day of Surgery : Do not apply any lotions/deodorants the morning of surgery.  Please wear clean clothes to the hospital/surgery center.  FAILURE TO FOLLOW THESE INSTRUCTIONS MAY RESULT IN THE CANCELLATION OF YOUR SURGERY  PATIENT SIGNATURE_________________________________  NURSE SIGNATURE__________________________________  ________________________________________________________________________

## 2020-10-20 NOTE — Progress Notes (Signed)
COVID Vaccine Completed: Yes Date COVID Vaccine completed: 11/14/2019 Has received booster: No COVID vaccine manufacturer: Pfizer     Date of COVID positive in last 90 days: No  PCP - Dibas Koirala, MD Cardiologist - N/A  Chest x-ray -  EKG -  Stress Test - N/A ECHO - 04/26/20 in care everywhere Cardiac Cath - N/A Pacemaker/ICD device last checked: N/A  Sleep Study - N/A CPAP - N/A  Fasting Blood Sugar - N/A Checks Blood Sugar _N/A____ times a day  Blood Thinner Instructions: Eliquis last dose 10/06/20 approx. Aspirin Instructions: N/A Last Dose:N/A  Activity level:  Able to exercise without symptoms     Anesthesia review: Bilateral segmental pulmonary emboli and right leg DVT 04/25/20    Patient denies shortness of breath, fever, cough and chest pain at PAT appointment   Patient verbalized understanding of instructions that were given to them at the PAT appointment. Patient was also instructed that they will need to review over the PAT instructions again at home before surgery.

## 2020-10-21 ENCOUNTER — Ambulatory Visit (HOSPITAL_COMMUNITY)
Admission: RE | Admit: 2020-10-21 | Discharge: 2020-10-21 | Disposition: A | Discharge status: Home / Self Care | Payer: BC Managed Care – PPO | Source: Ambulatory Visit | Attending: Surgery | Admitting: Surgery

## 2020-10-21 ENCOUNTER — Other Ambulatory Visit (HOSPITAL_COMMUNITY)
Admission: RE | Admit: 2020-10-21 | Discharge: 2020-10-21 | Disposition: A | Discharge status: Home / Self Care | Payer: BC Managed Care – PPO | Source: Ambulatory Visit | Attending: Surgery | Admitting: Surgery

## 2020-10-21 ENCOUNTER — Encounter (HOSPITAL_COMMUNITY)
Admission: RE | Admit: 2020-10-21 | Discharge: 2020-10-21 | Disposition: A | Discharge status: Home / Self Care | Payer: BC Managed Care – PPO | Source: Ambulatory Visit | Attending: Surgery | Admitting: Surgery

## 2020-10-21 DIAGNOSIS — Z01818 Encounter for other preprocedural examination: Secondary | ICD-10-CM | POA: Insufficient documentation

## 2020-10-21 DIAGNOSIS — Z20822 Contact with and (suspected) exposure to covid-19: Secondary | ICD-10-CM | POA: Diagnosis not present

## 2020-10-21 DIAGNOSIS — Z01810 Encounter for preprocedural cardiovascular examination: Secondary | ICD-10-CM

## 2020-10-21 LAB — CBC WITH DIFFERENTIAL/PLATELET
Abs Immature Granulocytes: 0.09 10*3/uL — ABNORMAL HIGH (ref 0.00–0.07)
Basophils Absolute: 0.1 10*3/uL (ref 0.0–0.1)
Basophils Relative: 1 %
Eosinophils Absolute: 0.1 10*3/uL (ref 0.0–0.5)
Eosinophils Relative: 1 %
HCT: 41.2 % (ref 39.0–52.0)
Hemoglobin: 14.6 g/dL (ref 13.0–17.0)
Immature Granulocytes: 1 %
Lymphocytes Relative: 23 %
Lymphs Abs: 2.7 10*3/uL (ref 0.7–4.0)
MCH: 35.5 pg — ABNORMAL HIGH (ref 26.0–34.0)
MCHC: 35.4 g/dL (ref 30.0–36.0)
MCV: 100.2 fL — ABNORMAL HIGH (ref 80.0–100.0)
Monocytes Absolute: 0.8 10*3/uL (ref 0.1–1.0)
Monocytes Relative: 6 %
Neutro Abs: 8.2 10*3/uL — ABNORMAL HIGH (ref 1.7–7.7)
Neutrophils Relative %: 68 %
Platelets: 287 10*3/uL (ref 150–400)
RBC: 4.11 MIL/uL — ABNORMAL LOW (ref 4.22–5.81)
RDW: 12.4 % (ref 11.5–15.5)
WBC: 11.9 10*3/uL — ABNORMAL HIGH (ref 4.0–10.5)
nRBC: 0 % (ref 0.0–0.2)

## 2020-10-21 LAB — BASIC METABOLIC PANEL
Anion gap: 9 (ref 5–15)
BUN: 12 mg/dL (ref 8–23)
CO2: 25 mmol/L (ref 22–32)
Calcium: 9.3 mg/dL (ref 8.9–10.3)
Chloride: 104 mmol/L (ref 98–111)
Creatinine, Ser: 0.8 mg/dL (ref 0.61–1.24)
GFR, Estimated: >60 mL/min (ref >60)
Glucose, Bld: 88 mg/dL (ref 70–99)
Potassium: 3.7 mmol/L (ref 3.5–5.1)
Sodium: 138 mmol/L (ref 135–145)

## 2020-10-21 LAB — SARS CORONAVIRUS 2 (TAT 6-24 HRS): SARS Coronavirus 2: NEGATIVE

## 2020-10-24 MED ORDER — BUPIVACAINE LIPOSOME 1.3 % IJ SUSP
20.0000 mL | Freq: Once | INTRAMUSCULAR | Status: DC
  Filled 2020-10-24: qty 20

## 2020-10-24 NOTE — Anesthesia Preprocedure Evaluation (Addendum)
Anesthesia Evaluation  Patient identified by MRN, date of birth, ID band Patient awake    Reviewed: Allergy & Precautions, H&P , NPO status , Patient's Chart, lab work & pertinent test results  Airway Mallampati: II  TM Distance: >3 FB Neck ROM: Full    Dental no notable dental hx. (+) Teeth Intact, Dental Advisory Given   Pulmonary neg pulmonary ROS, former smoker, PE   Pulmonary exam normal breath sounds clear to auscultation       Cardiovascular Exercise Tolerance: Good + DVT  negative cardio ROS Normal cardiovascular exam Rhythm:Regular Rate:Normal  EKG 3/25 Normal sinus rhythm Normal ECG    Neuro/Psych  Headaches,  Neuromuscular disease negative neurological ROS  negative psych ROS   GI/Hepatic negative GI ROS, Neg liver ROS, hiatal hernia, GERD  Medicated,  Endo/Other  negative endocrine ROS  Renal/GU negative Renal ROS  negative genitourinary   Musculoskeletal negative musculoskeletal ROS (+)   Abdominal   Peds negative pediatric ROS (+)  Hematology negative hematology ROS (+)   Anesthesia Other Findings   Reproductive/Obstetrics negative OB ROS                            Anesthesia Physical Anesthesia Plan  ASA: II  Anesthesia Plan: General   Post-op Pain Management:    Induction: Intravenous  PONV Risk Score and Plan: 2 and Ondansetron, Dexamethasone and Midazolam  Airway Management Planned: Oral ETT  Additional Equipment:   Intra-op Plan:   Post-operative Plan: Extubation in OR  Informed Consent: I have reviewed the patients History and Physical, chart, labs and discussed the procedure including the risks, benefits and alternatives for the proposed anesthesia with the patient or authorized representative who has indicated his/her understanding and acceptance.       Plan Discussed with: Anesthesiologist and CRNA  Anesthesia Plan Comments: (  )         Anesthesia Quick Evaluation

## 2020-10-25 ENCOUNTER — Ambulatory Visit (HOSPITAL_COMMUNITY): Payer: BC Managed Care – PPO | Admitting: Physician Assistant

## 2020-10-25 ENCOUNTER — Ambulatory Visit (HOSPITAL_COMMUNITY): Payer: BC Managed Care – PPO | Admitting: Anesthesiology

## 2020-10-25 ENCOUNTER — Encounter (HOSPITAL_COMMUNITY)
Admission: RE | Disposition: A | Discharge status: Home / Self Care | Payer: Self-pay | Source: Home / Self Care | Attending: Surgery

## 2020-10-25 ENCOUNTER — Encounter (HOSPITAL_COMMUNITY): Payer: Self-pay | Admitting: Surgery

## 2020-10-25 ENCOUNTER — Ambulatory Visit (HOSPITAL_COMMUNITY)
Admission: RE | Admit: 2020-10-25 | Discharge: 2020-10-25 | Disposition: A | Discharge status: Home / Self Care | Payer: BC Managed Care – PPO | Attending: Surgery | Admitting: Surgery

## 2020-10-25 DIAGNOSIS — K449 Diaphragmatic hernia without obstruction or gangrene: Secondary | ICD-10-CM | POA: Diagnosis not present

## 2020-10-25 DIAGNOSIS — Z86718 Personal history of other venous thrombosis and embolism: Secondary | ICD-10-CM | POA: Diagnosis not present

## 2020-10-25 DIAGNOSIS — K4090 Unilateral inguinal hernia, without obstruction or gangrene, not specified as recurrent: Secondary | ICD-10-CM | POA: Diagnosis not present

## 2020-10-25 DIAGNOSIS — K429 Umbilical hernia without obstruction or gangrene: Secondary | ICD-10-CM | POA: Insufficient documentation

## 2020-10-25 DIAGNOSIS — Z87891 Personal history of nicotine dependence: Secondary | ICD-10-CM | POA: Insufficient documentation

## 2020-10-25 DIAGNOSIS — G43909 Migraine, unspecified, not intractable, without status migrainosus: Secondary | ICD-10-CM | POA: Diagnosis not present

## 2020-10-25 DIAGNOSIS — Z86711 Personal history of pulmonary embolism: Secondary | ICD-10-CM | POA: Diagnosis not present

## 2020-10-25 DIAGNOSIS — K4020 Bilateral inguinal hernia, without obstruction or gangrene, not specified as recurrent: Secondary | ICD-10-CM | POA: Diagnosis not present

## 2020-10-25 DIAGNOSIS — Z79899 Other long term (current) drug therapy: Secondary | ICD-10-CM | POA: Insufficient documentation

## 2020-10-25 HISTORY — DX: Fibromyalgia: M79.7

## 2020-10-25 HISTORY — DX: Other pulmonary embolism without acute cor pulmonale: I26.99

## 2020-10-25 HISTORY — DX: Migraine, unspecified, not intractable, without status migrainosus: G43.909

## 2020-10-25 HISTORY — PX: XI ROBOTIC ASSISTED INGUINAL HERNIA REPAIR WITH MESH: SHX6706

## 2020-10-25 HISTORY — DX: Gastro-esophageal reflux disease without esophagitis: K21.9

## 2020-10-25 HISTORY — DX: Acute embolism and thrombosis of unspecified deep veins of unspecified lower extremity: I82.409

## 2020-10-25 HISTORY — DX: Pneumonia, unspecified organism: J18.9

## 2020-10-25 HISTORY — DX: Carpal tunnel syndrome, right upper limb: G56.01

## 2020-10-25 HISTORY — DX: Personal history of other diseases of the digestive system: Z87.19

## 2020-10-25 HISTORY — DX: Personal history of urinary calculi: Z87.442

## 2020-10-25 HISTORY — DX: Irritable bowel syndrome without diarrhea: K58.9

## 2020-10-25 SURGERY — REPAIR, HERNIA, INGUINAL, ROBOT-ASSISTED, LAPAROSCOPIC, USING MESH
Anesthesia: General | Site: Abdomen | Laterality: Bilateral

## 2020-10-25 MED ORDER — PROPOFOL 10 MG/ML IV BOLUS
INTRAVENOUS | Status: AC
  Filled 2020-10-25: qty 20

## 2020-10-25 MED ORDER — MEPERIDINE HCL 50 MG/ML IJ SOLN: 6.2500 mg | INTRAMUSCULAR | Status: DC | PRN

## 2020-10-25 MED ORDER — OXYCODONE HCL 5 MG/5ML PO SOLN: 5.0000 mg | Freq: Once | ORAL | Status: DC | PRN

## 2020-10-25 MED ORDER — CHLORHEXIDINE GLUCONATE 4 % EX LIQD: 60.0000 mL | Freq: Once | CUTANEOUS | Status: DC

## 2020-10-25 MED ORDER — CEFAZOLIN SODIUM-DEXTROSE 2-4 GM/100ML-% IV SOLN
2.0000 g | INTRAVENOUS | Status: AC
  Administered 2020-10-25: 2 g via INTRAVENOUS
  Filled 2020-10-25: qty 100

## 2020-10-25 MED ORDER — BUPIVACAINE LIPOSOME 1.3 % IJ SUSP
INTRAMUSCULAR | Status: DC | PRN
  Administered 2020-10-25: 20 mL

## 2020-10-25 MED ORDER — ACETAMINOPHEN 160 MG/5ML PO SOLN
325.0000 mg | ORAL | Status: DC | PRN
Start: 2020-10-25 — End: 2020-10-25

## 2020-10-25 MED ORDER — EPHEDRINE SULFATE-NACL 50-0.9 MG/10ML-% IV SOSY
PREFILLED_SYRINGE | INTRAVENOUS | Status: DC | PRN
  Administered 2020-10-25: 5 mg via INTRAVENOUS

## 2020-10-25 MED ORDER — FENTANYL CITRATE (PF) 250 MCG/5ML IJ SOLN
INTRAMUSCULAR | Status: AC
  Filled 2020-10-25: qty 5

## 2020-10-25 MED ORDER — MIDAZOLAM HCL 5 MG/5ML IJ SOLN
INTRAMUSCULAR | Status: DC | PRN
  Administered 2020-10-25: 2 mg via INTRAVENOUS

## 2020-10-25 MED ORDER — ONDANSETRON HCL 4 MG/2ML IJ SOLN: 4.0000 mg | Freq: Once | INTRAMUSCULAR | Status: DC | PRN

## 2020-10-25 MED ORDER — ACETAMINOPHEN 650 MG RE SUPP
650.0000 mg | RECTAL | Status: DC | PRN
  Filled 2020-10-25: qty 1

## 2020-10-25 MED ORDER — SODIUM CHLORIDE 0.9 % IV SOLN: 250.0000 mL | INTRAVENOUS | Status: DC | PRN

## 2020-10-25 MED ORDER — SUGAMMADEX SODIUM 200 MG/2ML IV SOLN
INTRAVENOUS | Status: DC | PRN
  Administered 2020-10-25: 200 mg via INTRAVENOUS

## 2020-10-25 MED ORDER — ROCURONIUM BROMIDE 10 MG/ML (PF) SYRINGE
PREFILLED_SYRINGE | INTRAVENOUS | Status: AC
  Filled 2020-10-25: qty 10

## 2020-10-25 MED ORDER — LIDOCAINE 2% (20 MG/ML) 5 ML SYRINGE
INTRAMUSCULAR | Status: AC
  Filled 2020-10-25: qty 5

## 2020-10-25 MED ORDER — ACETAMINOPHEN 500 MG PO TABS
1000.0000 mg | ORAL_TABLET | ORAL | Status: AC
  Administered 2020-10-25: 1000 mg via ORAL
  Filled 2020-10-25: qty 2

## 2020-10-25 MED ORDER — OXYCODONE HCL 5 MG PO TABS
5.0000 mg | ORAL_TABLET | ORAL | Status: DC | PRN
Start: 2020-10-25 — End: 2020-10-25

## 2020-10-25 MED ORDER — OXYCODONE HCL 5 MG PO TABS
5.0000 mg | ORAL_TABLET | Freq: Once | ORAL | Status: DC | PRN
Start: 2020-10-25 — End: 2020-10-25

## 2020-10-25 MED ORDER — FENTANYL CITRATE (PF) 250 MCG/5ML IJ SOLN
INTRAMUSCULAR | Status: DC | PRN
  Administered 2020-10-25: 50 ug via INTRAVENOUS
  Administered 2020-10-25: 100 ug via INTRAVENOUS
  Administered 2020-10-25 (×2): 50 ug via INTRAVENOUS

## 2020-10-25 MED ORDER — ACETAMINOPHEN 325 MG PO TABS: 650.0000 mg | ORAL_TABLET | ORAL | Status: DC | PRN

## 2020-10-25 MED ORDER — ORAL CARE MOUTH RINSE: 15.0000 mL | Freq: Once | OROMUCOSAL | Status: AC

## 2020-10-25 MED ORDER — CHLORHEXIDINE GLUCONATE 0.12 % MT SOLN
15.0000 mL | Freq: Once | OROMUCOSAL | Status: AC
  Administered 2020-10-25: 15 mL via OROMUCOSAL

## 2020-10-25 MED ORDER — ROCURONIUM BROMIDE 10 MG/ML (PF) SYRINGE
PREFILLED_SYRINGE | INTRAVENOUS | Status: DC | PRN
  Administered 2020-10-25: 10 mg via INTRAVENOUS
  Administered 2020-10-25: 20 mg via INTRAVENOUS
  Administered 2020-10-25: 60 mg via INTRAVENOUS

## 2020-10-25 MED ORDER — GABAPENTIN 300 MG PO CAPS
300.0000 mg | ORAL_CAPSULE | ORAL | Status: AC
  Administered 2020-10-25: 300 mg via ORAL
  Filled 2020-10-25: qty 1

## 2020-10-25 MED ORDER — ONDANSETRON 4 MG PO TBDP
ORAL_TABLET | ORAL | Status: AC
  Administered 2020-10-25: 4 mg
  Filled 2020-10-25: qty 1

## 2020-10-25 MED ORDER — FENTANYL CITRATE (PF) 100 MCG/2ML IJ SOLN
INTRAMUSCULAR | Status: AC
  Administered 2020-10-25: 50 ug via INTRAVENOUS
  Filled 2020-10-25: qty 2

## 2020-10-25 MED ORDER — BUPIVACAINE-EPINEPHRINE 0.25% -1:200000 IJ SOLN
INTRAMUSCULAR | Status: DC | PRN
  Administered 2020-10-25: 30 mL

## 2020-10-25 MED ORDER — BUPIVACAINE-EPINEPHRINE (PF) 0.25% -1:200000 IJ SOLN
INTRAMUSCULAR | Status: AC
  Filled 2020-10-25: qty 30

## 2020-10-25 MED ORDER — SODIUM CHLORIDE 0.9% FLUSH: 3.0000 mL | Freq: Two times a day (BID) | INTRAVENOUS | Status: DC

## 2020-10-25 MED ORDER — FENTANYL CITRATE (PF) 100 MCG/2ML IJ SOLN
25.0000 ug | INTRAMUSCULAR | Status: DC | PRN
Start: 2020-10-25 — End: 2020-10-25
  Administered 2020-10-25 (×2): 25 ug via INTRAVENOUS

## 2020-10-25 MED ORDER — LIDOCAINE 2% (20 MG/ML) 5 ML SYRINGE
INTRAMUSCULAR | Status: DC | PRN
  Administered 2020-10-25: 80 mg via INTRAVENOUS

## 2020-10-25 MED ORDER — ONDANSETRON HCL 4 MG/2ML IJ SOLN
INTRAMUSCULAR | Status: AC
  Filled 2020-10-25: qty 2

## 2020-10-25 MED ORDER — SODIUM CHLORIDE 0.9% FLUSH: 3.0000 mL | INTRAVENOUS | Status: DC | PRN

## 2020-10-25 MED ORDER — ONDANSETRON HCL 4 MG/2ML IJ SOLN
INTRAMUSCULAR | Status: DC | PRN
  Administered 2020-10-25: 4 mg via INTRAVENOUS

## 2020-10-25 MED ORDER — ONDANSETRON 4 MG PO TBDP
4.0000 mg | ORAL_TABLET | Freq: Once | ORAL | Status: AC
  Administered 2020-10-25: 4 mg via ORAL

## 2020-10-25 MED ORDER — MIDAZOLAM HCL 2 MG/2ML IJ SOLN
INTRAMUSCULAR | Status: AC
  Filled 2020-10-25: qty 2

## 2020-10-25 MED ORDER — ONDANSETRON 4 MG PO TBDP: 4.0000 mg | ORAL_TABLET | Freq: Three times a day (TID) | ORAL | 0 refills | Status: DC | PRN

## 2020-10-25 MED ORDER — 0.9 % SODIUM CHLORIDE (POUR BTL) OPTIME
TOPICAL | Status: DC | PRN
  Administered 2020-10-25: 1000 mL

## 2020-10-25 MED ORDER — ACETAMINOPHEN 325 MG PO TABS
325.0000 mg | ORAL_TABLET | ORAL | Status: DC | PRN
Start: 2020-10-25 — End: 2020-10-25

## 2020-10-25 MED ORDER — PROPOFOL 10 MG/ML IV BOLUS
INTRAVENOUS | Status: DC | PRN
  Administered 2020-10-25: 200 mg via INTRAVENOUS

## 2020-10-25 MED ORDER — LACTATED RINGERS IV SOLN: INTRAVENOUS | Status: DC

## 2020-10-25 MED ORDER — FENTANYL CITRATE (PF) 100 MCG/2ML IJ SOLN: 25.0000 ug | INTRAMUSCULAR | Status: DC | PRN

## 2020-10-25 MED ORDER — EPHEDRINE 5 MG/ML INJ
INTRAVENOUS | Status: AC
  Filled 2020-10-25: qty 10

## 2020-10-25 MED ORDER — DEXAMETHASONE SODIUM PHOSPHATE 10 MG/ML IJ SOLN
INTRAMUSCULAR | Status: DC | PRN
  Administered 2020-10-25: 10 mg via INTRAVENOUS

## 2020-10-25 MED ORDER — OXYCODONE HCL 5 MG PO TABS: 5.0000 mg | ORAL_TABLET | Freq: Three times a day (TID) | ORAL | 0 refills | Status: DC | PRN

## 2020-10-25 MED ORDER — DEXAMETHASONE SODIUM PHOSPHATE 10 MG/ML IJ SOLN
INTRAMUSCULAR | Status: AC
  Filled 2020-10-25: qty 1

## 2020-10-25 SURGICAL SUPPLY — 45 items
APL PRP STRL LF DISP 70% ISPRP (MISCELLANEOUS) ×1
APL SWBSTK 6 STRL LF DISP (MISCELLANEOUS) ×2
APPLICATOR COTTON TIP 6 STRL (MISCELLANEOUS) ×2 IMPLANT
APPLICATOR COTTON TIP 6IN STRL (MISCELLANEOUS) ×4
BLADE SURG SZ11 CARB STEEL (BLADE) ×2 IMPLANT
CHLORAPREP W/TINT 26 (MISCELLANEOUS) ×2 IMPLANT
COVER SURGICAL LIGHT HANDLE (MISCELLANEOUS) ×2 IMPLANT
COVER TIP SHEARS 8 DVNC (MISCELLANEOUS) ×1 IMPLANT
COVER TIP SHEARS 8MM DA VINCI (MISCELLANEOUS) ×2
COVER WAND RF STERILE (DRAPES) IMPLANT
DECANTER SPIKE VIAL GLASS SM (MISCELLANEOUS) ×2 IMPLANT
DRAPE ARM DVNC X/XI (DISPOSABLE) ×4 IMPLANT
DRAPE COLUMN DVNC XI (DISPOSABLE) ×1 IMPLANT
DRAPE DA VINCI XI ARM (DISPOSABLE) ×8
DRAPE DA VINCI XI COLUMN (DISPOSABLE) ×2
ELECT REM PT RETURN 15FT ADLT (MISCELLANEOUS) ×2 IMPLANT
GLOVE SURG ENC MOIS LTX SZ6 (GLOVE) ×4 IMPLANT
GLOVE SURG UNDER LTX SZ6.5 (GLOVE) ×4 IMPLANT
GOWN STRL REUS W/TWL LRG LVL3 (GOWN DISPOSABLE) ×4 IMPLANT
GOWN STRL REUS W/TWL XL LVL3 (GOWN DISPOSABLE) ×2 IMPLANT
KIT BASIN OR (CUSTOM PROCEDURE TRAY) ×2 IMPLANT
KIT TURNOVER KIT A (KITS) ×2 IMPLANT
MESH 3DMAX 4X6 LT LRG (Mesh General) ×1 IMPLANT
MESH 3DMAX 4X6 RT LRG (Mesh General) ×1 IMPLANT
NDL INSUFFLATION 14GA 120MM (NEEDLE) IMPLANT
NEEDLE HYPO 22GX1.5 SAFETY (NEEDLE) ×2 IMPLANT
NEEDLE INSUFFLATION 14GA 120MM (NEEDLE) IMPLANT
PACK CARDIOVASCULAR III (CUSTOM PROCEDURE TRAY) ×2 IMPLANT
PAD POSITIONING PINK XL (MISCELLANEOUS) ×2 IMPLANT
SEAL CANN UNIV 5-8 DVNC XI (MISCELLANEOUS) ×3 IMPLANT
SEAL XI 5MM-8MM UNIVERSAL (MISCELLANEOUS) ×6
SET IRRIG TUBING LAPAROSCOPIC (IRRIGATION / IRRIGATOR) IMPLANT
SOL ANTI FOG 6CC (MISCELLANEOUS) ×1 IMPLANT
SOLUTION ANTI FOG 6CC (MISCELLANEOUS) ×1
SOLUTION ELECTROLUBE (MISCELLANEOUS) ×2 IMPLANT
SPONGE LAP 18X18 RF (DISPOSABLE) ×2 IMPLANT
SUT MNCRL AB 4-0 PS2 18 (SUTURE) ×2 IMPLANT
SUT VIC AB 3-0 SH 27 (SUTURE) ×4
SUT VIC AB 3-0 SH 27XBRD (SUTURE) ×2 IMPLANT
SUT VLOC 180 2-0 6IN GS21 (SUTURE) IMPLANT
SYR 10ML LL (SYRINGE) ×2 IMPLANT
SYR 20ML LL LF (SYRINGE) ×2 IMPLANT
TOWEL OR 17X26 10 PK STRL BLUE (TOWEL DISPOSABLE) ×2 IMPLANT
TOWEL OR NON WOVEN STRL DISP B (DISPOSABLE) ×2 IMPLANT
TUBING INSUFFLATION 10FT LAP (TUBING) ×2 IMPLANT

## 2020-10-25 NOTE — Op Note (Signed)
Operative Note  BOND GRIESHOP  [id number]  [id number]  10/25/2020   Surgeon: Leeroy Bock ConnorMD  Procedure performed: Robotic repair of bilateral inguinal hernias, primary repair of umbilical hernia  Preop diagnosis: Bilateral inguinal hernias, umbilical hernia Post-op diagnosis/intraop findings: large left indirect inguinal hernia with moderate cord lipoma, right indirect and direct hernia, subcentimeter umbilical fascial defect  Specimens: no Retained items: no EBL: minimal cc Complications: none  Description of procedure: After obtaining informed consent the patient was taken to the operating room and placed supine on operating room table wheregeneral endotracheal anesthesia was initiated, preoperative antibiotics were administered, SCDs applied, and a formal timeout was performed.  Foley catheter was inserted which is removed at the end of the case.  The abdomen was prepped and draped in usual sterile fashion.  A small infraumbilical incision was made and peritoneal access gained through the umbilical hernia defect using a Veress needle followed by insufflation to 15 mmHg without incident.  An 8 mm robotic trocar was inserted followed by the camera and gross inspection demonstrated no injury from our entry and confirmed bilateral inguinal hernias as described above.  Bilateral laparoscopic assisted taps blocks were performed with Exparel mixed with quarter percent Marcaine and epinephrine.  Under direct visualization 2 additional 8 mm trochars were placed on the right and left side.  The patient was placed in Trendelenburg.  The robot was then docked and all instruments inserted under direct visualization.    Beginning on the patient's right side, the patient was noted to have a large direct and indirect hernia.  The peritoneal flap was developed from the anterior superior iliac spine to the medial umbilical ligament.  The flap was bluntly dissected off the anterior abdominal wall until  the hernia sac was reduced, the Cooper's ligament exposed medially and sufficient room for the mesh was created inferiorly.  The cord structures were parietal lysed and preserved.  The femoral vessels were visualized and confirmed no femoral or obturator hernia defect.  Hemostasis was confirmed and the dissection field. A Bard 3D max large mesh was then placed here with excellent overlap of the hernia defects.  The mesh was secured to the Cooper's ligament and superiorly on either side of the inferior epigastric vessels using interrupted 3-0 Vicryl suture.  The mesh is flush against the anterior abdominal wall, and there is no folding or buckling of the mesh.  The peritoneal flap was then brought back up to completely cover the mesh and was re-opposed to the abdominal wall with a running 3-0 V-loc, imbricating the peritoneum to minimize exposed V-Loc within the peritoneal cavity. At completion there was no exposed mesh present. Hemostasis was confirmed.  We then turned to the left side where there is a large indirect hernia. In a similar fashion a peritoneal flap was developed from the anterior superior iliac spine to the medial umbilical ligament.  The flap was bluntly dissected off the anterior abdominal wall until the hernia sac was reduced, the Cooper's ligament exposed medially and sufficient room for the mesh was created inferiorly.  There was a moderate sized cord lipoma which was excised and ultimately retrieved and discarded.  The cord structures were parietal lysed and preserved.  The femoral vessels again identified and no femoral or obturator hernia was present.  Hemostasis was confirmed and the dissection field. A left-sided Bard 3D max large mesh was then placed here with excellent overlap of the hernia defects.  The mesh was secured to the Cooper's ligament and superiorly on  either side of the inferior epigastric vessels using interrupted 3-0 Vicryl suture.  The mesh is flush against the anterior  abdominal wall, and there is no folding or buckling of the mesh.  The peritoneal flap was then brought back up to completely cover the mesh and was re-opposed to the abdominal wall with a running 3-0 V-loc, imbricating the peritoneum to minimize exposed V-Loc within the peritoneal cavity. At completion there was no exposed mesh present. Hemostasis was confirmed.  The excised cord lipoma was removed and discarded.  The umbilical hernia defect was noted to be less than 1 cm in diameter.  This was closed transversely with simple interrupted 0 Ethibonds.  Additional local was infiltrated in each of the skin incisions.  Incisions were then closed with subcuticular 4-0 Monocryl and Dermabond.  The patient was then awakened, extubated and taken to the recovery room in stable condition.  All counts were correct at the completion of the case.

## 2020-10-25 NOTE — Transfer of Care (Signed)
Immediate Anesthesia Transfer of Care Note  Patient: Parker Morgan  Procedure(s) Performed: XI ROBOTIC ASSISTED BILATERAL INGUINAL AND UMBILICAL HERNIA REPAIR WITH MESH (Bilateral Abdomen)  Patient Location: PACU  Anesthesia Type:General  Level of Consciousness: sedated  Airway & Oxygen Therapy: Patient Spontanous Breathing and Patient connected to face mask oxygen  Post-op Assessment: Report given to RN and Post -op Vital signs reviewed and stable  Post vital signs: Reviewed and stable  Last Vitals:  Vitals Value Taken Time  BP    Temp    Pulse 91 10/25/20 1028  Resp 13 10/25/20 1028  SpO2 99 % 10/25/20 1028  Vitals shown include unvalidated device data.  Last Pain:  Vitals:   10/25/20 0608  TempSrc: Oral  PainSc:          Complications: No complications documented.

## 2020-10-25 NOTE — Interval H&P Note (Signed)
History and Physical Interval Note:  10/25/2020 6:54 AM  Parker Morgan  has presented today for surgery, with the diagnosis of HERNIAS.  The various methods of treatment have been discussed with the patient and family. After consideration of risks, benefits and other options for treatment, the patient has consented to  Procedure(s): XI ROBOTIC ASSISTED BILATERAL INGUINAL AND UMBILICAL HERNIA REPAIR WITH MESH (Bilateral) as a surgical intervention.  The patient's history has been reviewed, patient examined, no change in status, stable for surgery.  I have reviewed the patient's chart and labs.  Questions were answered to the patient's satisfaction.     Derry Kassel Lollie Sails

## 2020-10-25 NOTE — Discharge Instructions (Signed)
HERNIA REPAIR: POST OP INSTRUCTIONS   EAT Gradually transition to a high fiber diet with a fiber supplement over the next few weeks after discharge.  Start with a pureed / full liquid diet (see below)  WALK Walk an hour a day (cumulative- not all at once).  Control your pain to do that.    CONTROL PAIN Control pain so that you can walk, sleep, tolerate sneezing/coughing, and go up/down stairs.  HAVE A BOWEL MOVEMENT DAILY Keep your bowels regular to avoid problems.  OK to try a laxative to override constipation.  OK to use an antidairrheal to slow down diarrhea.  Call if not better after 2 tries  CALL IF YOU HAVE PROBLEMS/CONCERNS Call if you are still struggling despite following these instructions. Call if you have concerns not answered by these instructions  ######################################################################    1. DIET: Follow a light bland diet & liquids the first 24 hours after arrival home, such as soup, liquids, starches, etc.  Be sure to drink plenty of fluids.  Quickly advance to a usual solid diet within a few days.  Avoid fast food or heavy meals as your are more likely to get nauseated or have irregular bowels.  A low-sugar, high-fiber diet for the rest of your life is ideal.   2. Take your usually prescribed home medications unless otherwise directed.  3. PAIN CONTROL: a. Pain is best controlled by a usual combination of three different methods TOGETHER: i. Ice/Heat ii. Over the counter pain medication iii. Prescription pain medication b. Most patients will experience some swelling and bruising around the hernia(s) such as the bellybutton, groins, or old incisions.  Ice packs or heating pads (30-60 minutes up to 6 times a day) will help. Use ice for the first few days to help decrease swelling and bruising, then switch to heat to help relax tight/sore spots and speed recovery.  Some people prefer to use ice alone, heat alone, alternating between ice &  heat.  Experiment to what works for you.  Swelling and bruising can take several weeks to resolve.   c. It is helpful to take an over-the-counter pain medication regularly for the first days: i. Naproxen (Aleve, etc)  Two 220mg  tabs twice a day OR Ibuprofen (Advil, etc) Three 200mg  tabs four times a day (every meal & bedtime) AND ii. Acetaminophen (Tylenol, etc) 325-650mg  four times a day (every meal & bedtime) d. You may use your prescription pain medication (hydrocodone-acetaminophen 10/325) for additional pain control if needed. i. If you are having problems/concerns with the prescription medicine (does not control pain, nausea, vomiting, rash, itching, etc), please call [phone] to see if we need to switch you to a different pain medicine that will work better for you and/or control your side effect better. ii. If you need a refill on your pain medication, please contact your pharmacy.  They will contact our office to request authorization. Prescriptions will not be filled after 5 pm or on week-ends.  4. Avoid getting constipated.  Between the surgery and the pain medications, it is common to experience some constipation.  Increasing fluid intake and taking a fiber supplement (such as Metamucil, Citrucel, FiberCon, MiraLax, etc) 1-2 times a day regularly will usually help prevent this problem from occurring.  A mild laxative (prune juice, Milk of Magnesia, MiraLax, etc) should be taken according to package directions if there are no bowel movements after 48 hours.    5. Wash / shower every day, starting 2 days  after surgery.  You may shower over the glue which is waterproof.    6. Glue will flake off after 1-2 weeks.  You may leave the incision open to air.  You may replace a dressing/Band-Aid to cover an incision for comfort if you wish.  Continue to shower over incision(s) after the dressing is off.  7. ACTIVITIES as tolerated:   a. You may resume regular (light) daily activities  beginning the next day--such as daily self-care, walking, climbing stairs--gradually increasing activities as tolerated.  Control your pain so that you can walk an hour a day.  If you can walk 30 minutes without difficulty, it is safe to try more intense activity such as jogging, treadmill, bicycling, low-impact aerobics, swimming, etc. b. Refrain from the most intensive and strenuous activity such as sit-ups, heavy lifting, contact sports, etc  Refrain from any heavy lifting or straining until 6 weeks after surgery.   c. DO NOT PUSH THROUGH PAIN.  Let pain be your guide: If it hurts to do something, don't do it.  Pain is your body warning you to avoid that activity for another week until the pain goes down. d. You may drive when you are no longer taking prescription pain medication, you can comfortably wear a seatbelt, and you can safely maneuver your car and apply brakes. e. Bonita Quin may have sexual intercourse when it is comfortable.   8. FOLLOW UP in our office a. Please call [facility] at [phone] to set up an appointment to see your surgeon in the office for a follow-up appointment approximately 2-3 weeks after your surgery. b. Make sure that you call for this appointment the day you arrive home to insure a convenient appointment time.  9.  If you have disability of FMLA / Family leave forms, please bring the forms to the office for processing.  (do not give to your surgeon).  WHEN TO CALL us [phone]: 1. Poor pain control 2. Reactions / problems with new medications (rash/itching, nausea, etc)  3. Fever over 101.5 F (38.5 C) 4. Inability to urinate 5. Nausea and/or vomiting 6. Worsening swelling or bruising 7. Continued bleeding from incision. 8. Increased pain, redness, or drainage from the incision   The clinic staff is available to answer your questions during regular business hours (8:30am-5pm).  Please don't hesitate to call and ask to speak to one of our nurses for clinical  concerns.   If you have a medical emergency, go to the nearest emergency room or call 911.  A surgeon from [facility] is always on call at the hospitals in [facility], [place] [address], [address], [place], [place]  [address] ?  [address] [address], [place], [place]   [address] MAIN: [phone] ? TOLL FREE: [phone] ? FAX: [phone] [email or web address]

## 2020-10-25 NOTE — Anesthesia Procedure Notes (Signed)
Procedure Name: Intubation Date/Time: 10/25/2020 7:36 AM Performed by: Talbot Grumbling, CRNA Pre-anesthesia Checklist: Patient identified, Emergency Drugs available, Suction available and Patient being monitored Patient Re-evaluated:Patient Re-evaluated prior to induction Oxygen Delivery Method: Circle system utilized Preoxygenation: Pre-oxygenation with 100% oxygen Induction Type: IV induction Ventilation: Mask ventilation without difficulty Laryngoscope Size: Mac and 3 Grade View: Grade I Tube type: Oral Tube size: 7.5 mm Number of attempts: 1 (EMT student attempt to visualize without success.) Airway Equipment and Method: Stylet Placement Confirmation: ETT inserted through vocal cords under direct vision,  positive ETCO2 and breath sounds checked- equal and bilateral Secured at: 22 cm Tube secured with: Tape Dental Injury: Teeth and Oropharynx as per pre-operative assessment

## 2020-10-25 NOTE — Progress Notes (Signed)
Patient reports that there is an error in the PDMP database- there are opioid prescriptions going back to before he even moved to [place] from [place] (last June) and these are not him. The last 4 were filled at [facility] and he uses [facility] community. I attempted to call the pharmacy to see how to get this clarified/ corrected, but could not sit on hold long enough to speak to anyone. I prescribed him oxycodone for post-op pain control, and his nurse was going to investigate who might be able to help with this from the hospital side.

## 2020-10-26 ENCOUNTER — Encounter (HOSPITAL_COMMUNITY): Payer: Self-pay | Admitting: Surgery

## 2020-10-26 NOTE — Anesthesia Postprocedure Evaluation (Signed)
Anesthesia Post Note  Patient: [name]  Procedure(s) Performed: XI ROBOTIC ASSISTED BILATERAL INGUINAL AND UMBILICAL HERNIA REPAIR WITH MESH (Bilateral Abdomen)     Patient location during evaluation: PACU Anesthesia Type: General Level of consciousness: awake and alert Pain management: pain level controlled Vital Signs Assessment: post-procedure vital signs reviewed and stable Respiratory status: spontaneous breathing, nonlabored ventilation, respiratory function stable and patient connected to nasal cannula oxygen Cardiovascular status: blood pressure returned to baseline and stable Postop Assessment: no apparent nausea or vomiting Anesthetic complications: no   No complications documented.  Last Vitals:  Vitals:   10/25/20 1229 10/25/20 1245  BP:    Pulse: 72 70  Resp: 16 16  Temp:    SpO2:      Last Pain:  Vitals:   10/25/20 1145  TempSrc: Oral  PainSc: 3                  Parker Morgan

## 2020-12-19 DIAGNOSIS — Z0000 Encounter for general adult medical examination without abnormal findings: Secondary | ICD-10-CM | POA: Diagnosis not present

## 2020-12-19 DIAGNOSIS — Z13220 Encounter for screening for lipoid disorders: Secondary | ICD-10-CM | POA: Diagnosis not present

## 2020-12-19 DIAGNOSIS — Z131 Encounter for screening for diabetes mellitus: Secondary | ICD-10-CM | POA: Diagnosis not present

## 2020-12-22 DIAGNOSIS — Z0000 Encounter for general adult medical examination without abnormal findings: Secondary | ICD-10-CM | POA: Diagnosis not present

## 2020-12-22 DIAGNOSIS — F5101 Primary insomnia: Secondary | ICD-10-CM | POA: Diagnosis not present

## 2020-12-22 DIAGNOSIS — G43909 Migraine, unspecified, not intractable, without status migrainosus: Secondary | ICD-10-CM | POA: Diagnosis not present

## 2020-12-22 DIAGNOSIS — D6859 Other primary thrombophilia: Secondary | ICD-10-CM | POA: Diagnosis not present

## 2020-12-22 DIAGNOSIS — Z79899 Other long term (current) drug therapy: Secondary | ICD-10-CM | POA: Diagnosis not present

## 2021-01-11 DIAGNOSIS — Z0000 Encounter for general adult medical examination without abnormal findings: Secondary | ICD-10-CM | POA: Diagnosis not present

## 2021-01-11 DIAGNOSIS — R202 Paresthesia of skin: Secondary | ICD-10-CM | POA: Diagnosis not present

## 2021-01-11 DIAGNOSIS — M19042 Primary osteoarthritis, left hand: Secondary | ICD-10-CM | POA: Diagnosis not present

## 2021-01-11 DIAGNOSIS — R200 Anesthesia of skin: Secondary | ICD-10-CM | POA: Diagnosis not present

## 2021-01-11 DIAGNOSIS — G5602 Carpal tunnel syndrome, left upper limb: Secondary | ICD-10-CM | POA: Diagnosis not present

## 2021-02-14 DIAGNOSIS — R609 Edema, unspecified: Secondary | ICD-10-CM | POA: Diagnosis not present

## 2021-02-14 DIAGNOSIS — G629 Polyneuropathy, unspecified: Secondary | ICD-10-CM | POA: Diagnosis not present

## 2021-03-09 DIAGNOSIS — M353 Polymyalgia rheumatica: Secondary | ICD-10-CM | POA: Diagnosis not present

## 2021-03-22 DIAGNOSIS — G5602 Carpal tunnel syndrome, left upper limb: Secondary | ICD-10-CM | POA: Diagnosis not present

## 2021-03-22 DIAGNOSIS — R200 Anesthesia of skin: Secondary | ICD-10-CM | POA: Diagnosis not present

## 2021-03-22 DIAGNOSIS — R202 Paresthesia of skin: Secondary | ICD-10-CM | POA: Diagnosis not present

## 2021-03-23 ENCOUNTER — Other Ambulatory Visit: Payer: Self-pay | Admitting: Orthopedic Surgery

## 2021-03-27 DIAGNOSIS — G5793 Unspecified mononeuropathy of bilateral lower limbs: Secondary | ICD-10-CM | POA: Diagnosis not present

## 2021-03-27 DIAGNOSIS — I739 Peripheral vascular disease, unspecified: Secondary | ICD-10-CM | POA: Diagnosis not present

## 2021-03-27 DIAGNOSIS — M353 Polymyalgia rheumatica: Secondary | ICD-10-CM | POA: Diagnosis not present

## 2021-03-31 ENCOUNTER — Other Ambulatory Visit: Payer: Self-pay | Admitting: Family Medicine

## 2021-03-31 DIAGNOSIS — I739 Peripheral vascular disease, unspecified: Secondary | ICD-10-CM

## 2021-04-04 DIAGNOSIS — R5382 Chronic fatigue, unspecified: Secondary | ICD-10-CM | POA: Diagnosis not present

## 2021-04-04 DIAGNOSIS — M25511 Pain in right shoulder: Secondary | ICD-10-CM | POA: Diagnosis not present

## 2021-04-04 DIAGNOSIS — L409 Psoriasis, unspecified: Secondary | ICD-10-CM | POA: Diagnosis not present

## 2021-04-04 DIAGNOSIS — M25512 Pain in left shoulder: Secondary | ICD-10-CM | POA: Diagnosis not present

## 2021-04-04 DIAGNOSIS — Z8739 Personal history of other diseases of the musculoskeletal system and connective tissue: Secondary | ICD-10-CM | POA: Diagnosis not present

## 2021-04-07 ENCOUNTER — Other Ambulatory Visit: Payer: Self-pay

## 2021-04-12 ENCOUNTER — Ambulatory Visit
Admission: RE | Admit: 2021-04-12 | Discharge: 2021-04-12 | Disposition: A | Discharge status: Home / Self Care | Payer: BC Managed Care – PPO | Source: Ambulatory Visit | Attending: Family Medicine | Admitting: Family Medicine

## 2021-04-12 DIAGNOSIS — M79661 Pain in right lower leg: Secondary | ICD-10-CM | POA: Diagnosis not present

## 2021-04-12 DIAGNOSIS — M79662 Pain in left lower leg: Secondary | ICD-10-CM | POA: Diagnosis not present

## 2021-04-12 DIAGNOSIS — I739 Peripheral vascular disease, unspecified: Secondary | ICD-10-CM

## 2021-04-12 NOTE — Progress Notes (Signed)

## 2021-04-18 ENCOUNTER — Other Ambulatory Visit: Payer: Self-pay

## 2021-04-18 ENCOUNTER — Encounter (HOSPITAL_BASED_OUTPATIENT_CLINIC_OR_DEPARTMENT_OTHER): Payer: Self-pay | Admitting: Orthopedic Surgery

## 2021-04-18 ENCOUNTER — Encounter (HOSPITAL_BASED_OUTPATIENT_CLINIC_OR_DEPARTMENT_OTHER)
Admission: RE | Disposition: A | Discharge status: Home / Self Care | Payer: Self-pay | Source: Ambulatory Visit | Attending: Orthopedic Surgery

## 2021-04-18 ENCOUNTER — Ambulatory Visit (HOSPITAL_BASED_OUTPATIENT_CLINIC_OR_DEPARTMENT_OTHER): Payer: BC Managed Care – PPO | Admitting: Anesthesiology

## 2021-04-18 ENCOUNTER — Ambulatory Visit (HOSPITAL_BASED_OUTPATIENT_CLINIC_OR_DEPARTMENT_OTHER)
Admission: RE | Admit: 2021-04-18 | Discharge: 2021-04-18 | Disposition: A | Discharge status: Home / Self Care | Payer: BC Managed Care – PPO | Source: Ambulatory Visit | Attending: Orthopedic Surgery | Admitting: Orthopedic Surgery

## 2021-04-18 DIAGNOSIS — Z86711 Personal history of pulmonary embolism: Secondary | ICD-10-CM | POA: Diagnosis not present

## 2021-04-18 DIAGNOSIS — Z86718 Personal history of other venous thrombosis and embolism: Secondary | ICD-10-CM | POA: Insufficient documentation

## 2021-04-18 DIAGNOSIS — K589 Irritable bowel syndrome without diarrhea: Secondary | ICD-10-CM | POA: Diagnosis not present

## 2021-04-18 DIAGNOSIS — G43909 Migraine, unspecified, not intractable, without status migrainosus: Secondary | ICD-10-CM | POA: Diagnosis not present

## 2021-04-18 DIAGNOSIS — G5602 Carpal tunnel syndrome, left upper limb: Secondary | ICD-10-CM | POA: Diagnosis not present

## 2021-04-18 DIAGNOSIS — Z87891 Personal history of nicotine dependence: Secondary | ICD-10-CM | POA: Insufficient documentation

## 2021-04-18 DIAGNOSIS — Z91040 Latex allergy status: Secondary | ICD-10-CM | POA: Insufficient documentation

## 2021-04-18 DIAGNOSIS — M1990 Unspecified osteoarthritis, unspecified site: Secondary | ICD-10-CM | POA: Diagnosis not present

## 2021-04-18 DIAGNOSIS — K219 Gastro-esophageal reflux disease without esophagitis: Secondary | ICD-10-CM | POA: Diagnosis not present

## 2021-04-18 HISTORY — PX: CARPAL TUNNEL RELEASE: SHX101

## 2021-04-18 SURGERY — CARPAL TUNNEL RELEASE
Anesthesia: Regional | Site: Hand | Laterality: Left

## 2021-04-18 MED ORDER — FENTANYL CITRATE (PF) 100 MCG/2ML IJ SOLN
INTRAMUSCULAR | Status: AC
  Filled 2021-04-18: qty 2

## 2021-04-18 MED ORDER — ONDANSETRON HCL 4 MG/2ML IJ SOLN
INTRAMUSCULAR | Status: DC | PRN
Start: 2021-04-18 — End: 2021-04-18
  Administered 2021-04-18: 4 mg via INTRAVENOUS

## 2021-04-18 MED ORDER — FENTANYL CITRATE (PF) 100 MCG/2ML IJ SOLN
INTRAMUSCULAR | Status: DC | PRN
  Administered 2021-04-18: 100 ug via INTRAVENOUS

## 2021-04-18 MED ORDER — MIDAZOLAM HCL 2 MG/2ML IJ SOLN
INTRAMUSCULAR | Status: AC
  Filled 2021-04-18: qty 2

## 2021-04-18 MED ORDER — LACTATED RINGERS IV SOLN: INTRAVENOUS | Status: DC

## 2021-04-18 MED ORDER — PROPOFOL 500 MG/50ML IV EMUL
INTRAVENOUS | Status: DC | PRN
  Administered 2021-04-18: 50 ug/kg/min via INTRAVENOUS

## 2021-04-18 MED ORDER — TRAMADOL HCL 50 MG PO TABS: 50.0000 mg | ORAL_TABLET | Freq: Four times a day (QID) | ORAL | 0 refills | Status: AC | PRN

## 2021-04-18 MED ORDER — OXYCODONE HCL 5 MG PO TABS
5.0000 mg | ORAL_TABLET | Freq: Once | ORAL | Status: DC | PRN
Start: 2021-04-18 — End: 2021-04-18

## 2021-04-18 MED ORDER — PROPOFOL 10 MG/ML IV BOLUS
INTRAVENOUS | Status: DC | PRN
  Administered 2021-04-18: 20 mg via INTRAVENOUS
  Administered 2021-04-18: 50 mg via INTRAVENOUS
  Administered 2021-04-18: 30 mg via INTRAVENOUS

## 2021-04-18 MED ORDER — BUPIVACAINE HCL (PF) 0.25 % IJ SOLN
INTRAMUSCULAR | Status: DC | PRN
  Administered 2021-04-18: 8.5 mL

## 2021-04-18 MED ORDER — OXYCODONE HCL 5 MG/5ML PO SOLN: 5.0000 mg | Freq: Once | ORAL | Status: DC | PRN

## 2021-04-18 MED ORDER — LIDOCAINE HCL (PF) 0.5 % IJ SOLN
INTRAMUSCULAR | Status: DC | PRN
  Administered 2021-04-18: 30 mL via INTRAVENOUS

## 2021-04-18 MED ORDER — MIDAZOLAM HCL 5 MG/5ML IJ SOLN
INTRAMUSCULAR | Status: DC | PRN
Start: 2021-04-18 — End: 2021-04-18
  Administered 2021-04-18: 2 mg via INTRAVENOUS

## 2021-04-18 MED ORDER — CEFAZOLIN SODIUM-DEXTROSE 2-4 GM/100ML-% IV SOLN
INTRAVENOUS | Status: AC
  Filled 2021-04-18: qty 100

## 2021-04-18 MED ORDER — CEFAZOLIN SODIUM-DEXTROSE 2-4 GM/100ML-% IV SOLN
2.0000 g | INTRAVENOUS | Status: AC
  Administered 2021-04-18: 2 g via INTRAVENOUS

## 2021-04-18 MED ORDER — PROMETHAZINE HCL 25 MG/ML IJ SOLN: 6.2500 mg | INTRAMUSCULAR | Status: DC | PRN

## 2021-04-18 MED ORDER — 0.9 % SODIUM CHLORIDE (POUR BTL) OPTIME
TOPICAL | Status: DC | PRN
  Administered 2021-04-18: 100 mL

## 2021-04-18 MED ORDER — HYDROMORPHONE HCL 1 MG/ML IJ SOLN: 0.2500 mg | INTRAMUSCULAR | Status: DC | PRN

## 2021-04-18 SURGICAL SUPPLY — 34 items
APL PRP STRL LF DISP 70% ISPRP (MISCELLANEOUS) ×1
BLADE SURG 15 STRL LF DISP TIS (BLADE) ×1 IMPLANT
BLADE SURG 15 STRL SS (BLADE) ×2
BNDG CMPR 9X4 STRL LF SNTH (GAUZE/BANDAGES/DRESSINGS) ×1
BNDG COHESIVE 3X5 TAN ST LF (GAUZE/BANDAGES/DRESSINGS) ×2 IMPLANT
BNDG ESMARK 4X9 LF (GAUZE/BANDAGES/DRESSINGS) ×1 IMPLANT
BNDG GAUZE ELAST 4 BULKY (GAUZE/BANDAGES/DRESSINGS) ×2 IMPLANT
CHLORAPREP W/TINT 26 (MISCELLANEOUS) ×2 IMPLANT
CORD BIPOLAR FORCEPS 12FT (ELECTRODE) ×2 IMPLANT
COVER BACK TABLE 60X90IN (DRAPES) ×2 IMPLANT
COVER MAYO STAND STRL (DRAPES) ×2 IMPLANT
CUFF TOURN SGL QUICK 18X4 (TOURNIQUET CUFF) ×1 IMPLANT
DRAPE EXTREMITY T 121X128X90 (DISPOSABLE) ×2 IMPLANT
DRAPE SURG 17X23 STRL (DRAPES) ×2 IMPLANT
DRSG PAD ABDOMINAL 8X10 ST (GAUZE/BANDAGES/DRESSINGS) ×2 IMPLANT
GAUZE 4X4 16PLY ~~LOC~~+RFID DBL (SPONGE) ×1 IMPLANT
GAUZE SPONGE 4X4 12PLY STRL (GAUZE/BANDAGES/DRESSINGS) ×2 IMPLANT
GAUZE XEROFORM 1X8 LF (GAUZE/BANDAGES/DRESSINGS) ×2 IMPLANT
GLOVE SURG ORTHO LTX SZ8 (GLOVE) ×2 IMPLANT
GLOVE SURG UNDER POLY LF SZ8.5 (GLOVE) ×2 IMPLANT
GOWN STRL REUS W/ TWL LRG LVL3 (GOWN DISPOSABLE) ×1 IMPLANT
GOWN STRL REUS W/TWL LRG LVL3 (GOWN DISPOSABLE) ×2
GOWN STRL REUS W/TWL XL LVL3 (GOWN DISPOSABLE) ×2 IMPLANT
NDL PRECISIONGLIDE 27X1.5 (NEEDLE) IMPLANT
NEEDLE PRECISIONGLIDE 27X1.5 (NEEDLE) ×2 IMPLANT
NS IRRIG 1000ML POUR BTL (IV SOLUTION) ×2 IMPLANT
PACK BASIN DAY SURGERY FS (CUSTOM PROCEDURE TRAY) ×2 IMPLANT
STOCKINETTE 4X48 STRL (DRAPES) ×2 IMPLANT
SUT ETHILON 4 0 PS 2 18 (SUTURE) ×2 IMPLANT
SUT VICRYL 4-0 PS2 18IN ABS (SUTURE) IMPLANT
SYR BULB EAR ULCER 3OZ GRN STR (SYRINGE) ×2 IMPLANT
SYR CONTROL 10ML LL (SYRINGE) ×1 IMPLANT
TOWEL GREEN STERILE FF (TOWEL DISPOSABLE) ×2 IMPLANT
UNDERPAD 30X36 HEAVY ABSORB (UNDERPADS AND DIAPERS) ×1 IMPLANT

## 2021-04-18 NOTE — H&P (Signed)
Parker Morgan is an 65 y.o. male.   Chief Complaint: Numbness tingling left hand HPI: Parker Morgan is a 65 year old right-hand-dominant male referred by Dr. Docia Chuck for consultation regarding his left hand. He is complaining of numbness tingling and pain in his left side. He is status post carpal tunnel release on his right done in [place]. He had nerve conductions done prior to the carpal tunnel release on his right side which revealed a motor delay of greater than 6 his left side showed a motor delay of 4. He states that talking on phone and writing his computer I will increase his symptoms form is waking 7 out of 7 nights with numbness and tingling left hand along with a burning type pain. He has no history of injury to the hand or to the neck. He does have neck pain however. He has been doing taking Tylenol and Advil. He has not been wearing a brace. He has had x-rays done by Dr.Koirala . He has a history of arthritis no history of diabetes thyroid problems or gout. Family history is positive thyroid problems he was referred for nerve conductions. These were done by Dr. Neldon Newport. This reveals a significant carpal tunnel syndrome left side with a motor delay of 7 7 no response to the sensory component and enlargement of the nerve to  Past Medical History:  Diagnosis Date   Carpal tunnel syndrome on right    DVT (deep venous thrombosis) (HCC)    right leg DVT   Fibromyalgia    GERD (gastroesophageal reflux disease)    History of hiatal hernia    History of kidney stones    IBS (irritable bowel syndrome)    Insomnia    Migraines    Pneumonia    History of   Pulmonary embolism (HCC)    Bilateral segmental pulmonary emboli     Past Surgical History:  Procedure Laterality Date   BACK SURGERY     CARPAL TUNNEL RELEASE Right    FOREARM SURGERY Left    KNEE SURGERY Right    LITHOTRIPSY     XI ROBOTIC ASSISTED INGUINAL HERNIA REPAIR WITH MESH Bilateral 10/25/2020   Procedure: XI ROBOTIC ASSISTED  BILATERAL INGUINAL AND UMBILICAL HERNIA REPAIR WITH MESH;  Surgeon: Berna Bue, MD;  Location: [facility];  Service: General;  Laterality: Bilateral;    Family History  Problem Relation Age of Onset   Graves' disease Mother    Healthy Father    Social History:  reports that he quit smoking about 32 years ago. His smoking use included cigarettes. He has never used smokeless tobacco. He reports that he does not currently use alcohol after a past usage of about 5.0 standard drinks per week. He reports that he does not use drugs.  Allergies:  Allergies  Allergen Reactions   Lactose Intolerance (Gi)     GI UPSET   Latex Rash    No medications prior to admission.    No results found for this or any previous visit (from the past 48 hour(s)).  No results found.   Pertinent items are noted in HPI.  Height 5\' 7"  (1.702 m), weight 81.6 kg.  General appearance: alert, cooperative, and appears stated age Head: Normocephalic, without obvious abnormality Neck: no JVD Resp: clear to auscultation bilaterally Cardio: regular rate and rhythm, S1, S2 normal, no murmur, click, rub or gallop GI: soft, non-tender; bowel sounds normal; no masses,  no organomegaly Extremities:  Numbness tingling left hand Pulses: 2+ and  symmetric Skin: Skin color, texture, turgor normal. No rashes or lesions Neurologic: Grossly normal Incision/Wound: na  Assessment/Plan Diagnosis left carpal tunnel syndrome Plan: He would like to proceed to surgical decompression of the median nerve left side. Pre-. Postoperative course been discussed along with risk and complications. He is aware that there is no guarantee to the surgery the possibility of infection recurrence injury to arteries nerves tendons incomplete relief of symptoms and dystrophy. He is scheduled for left carpal tunnel release in outpatient under regional anesthesia. Cindee Salt 04/18/2021, 8:37 AM

## 2021-04-18 NOTE — Discharge Instructions (Addendum)

## 2021-04-18 NOTE — Anesthesia Preprocedure Evaluation (Signed)
Anesthesia Evaluation  Patient identified by MRN, date of birth, ID band Patient awake    Reviewed: Allergy & Precautions, H&P , NPO status , Patient's Chart, lab work & pertinent test results  Airway Mallampati: II  TM Distance: >3 FB Neck ROM: Full    Dental no notable dental hx. (+) Teeth Intact, Dental Advisory Given   Pulmonary neg pulmonary ROS, former smoker, PE   Pulmonary exam normal breath sounds clear to auscultation       Cardiovascular Exercise Tolerance: Good + DVT  negative cardio ROS Normal cardiovascular exam Rhythm:Regular Rate:Normal  EKG 3/25 Normal sinus rhythm Normal ECG    Neuro/Psych  Headaches,  Neuromuscular disease negative neurological ROS  negative psych ROS   GI/Hepatic negative GI ROS, Neg liver ROS, hiatal hernia, GERD  Medicated,  Endo/Other  negative endocrine ROS  Renal/GU negative Renal ROS  negative genitourinary   Musculoskeletal  (+) Fibromyalgia -  Abdominal   Peds negative pediatric ROS (+)  Hematology negative hematology ROS (+)   Anesthesia Other Findings   Reproductive/Obstetrics negative OB ROS                             Anesthesia Physical  Anesthesia Plan  ASA: II  Anesthesia Plan: Bier Block and Bier Block-LIDOCAINE ONLY   Post-op Pain Management:    Induction: Intravenous  PONV Risk Score and Plan: 1 and Ondansetron and Treatment may vary due to age or medical condition  Airway Management Planned: Simple Face Mask  Additional Equipment:   Intra-op Plan:   Post-operative Plan:   Informed Consent: I have reviewed the patients History and Physical, chart, labs and discussed the procedure including the risks, benefits and alternatives for the proposed anesthesia with the patient or authorized representative who has indicated his/her understanding and acceptance.       Plan Discussed with: Anesthesiologist and  CRNA  Anesthesia Plan Comments: (  )        Anesthesia Quick Evaluation

## 2021-04-18 NOTE — Op Note (Signed)
NAME: Parker Morgan MEDICAL RECORD NO: [id number] DATE OF BIRTH: 13-May-1956 FACILITY: [facility] LOCATION: [facility] PHYSICIAN: Nicki Reaper, MD   OPERATIVE REPORT   DATE OF PROCEDURE: 04/18/21    PREOPERATIVE DIAGNOSIS: Carpal tunnel syndrome left hand   POSTOPERATIVE DIAGNOSIS: Same   PROCEDURE: Decompression median nerve left   SURGEON: Cindee Salt, M.D.   ASSISTANT: none   ANESTHESIA:  Bier block with sedation and Local   INTRAVENOUS FLUIDS:  Per anesthesia flow sheet.   ESTIMATED BLOOD LOSS:  Minimal.   COMPLICATIONS:  None.   SPECIMENS:  none   TOURNIQUET TIME:    Total Tourniquet Time Documented: Forearm (Left) - 29 minutes Total: Forearm (Left) - 29 minutes    DISPOSITION:  Stable to PACU.   INDICATIONS: Patient is a 65 year old male with history of numbness and tingling bilateral hands.  This not responded to conservative treatment he is elected undergo surgical decompression of the left median nerve.  Nerve conductions are positive.  Pre-.  Postoperative course been discussed along with risk complications.  He is aware that there is no guarantee to the surgery the possibility of infection recurrence injury to arteries nerves tendons incomplete relief symptoms dystrophy.  In the preoperative area the patient is seen extremity marked by both patient and surgeon antibiotic given  OPERATIVE COURSE: Patient is brought the operating room placed in the supine position with left arm free.  Forearm IV regional anesthetic was carried out without difficulty under the direction the anesthesia department.  He was prepped with ChloraPrep.  A 3-minute dry time was allowed timeout taken confirming patient and procedure.  A longitudinal incision was made left palm carried down through subcutaneous tissue.  Bleeders were electrocauterized with bipolar.  Palmar fascia was split.  Superficial palmar arch along with flexor tendon to the ring little finger were  identified.  Retractors were placed retracting median nerve radially ulnar nerve ulnarly.  The flexor retinaculum was then released on its ulnar border.  Right angle and stool retractor placed between skin forearm fascia and the fascia released for approximately 2 to 3 cm proximal to the wrist crease under direct vision after dissecting deep structures free with blunt dissection.  The canal was explored.  Motor branch was identified distally.  The nerve showed a area of compression with a area of hyperemia.  No further lesions were identified.  The wound was copiously irrigated with saline.  The skin was closed interrupted 4-0 nylon sutures.  Local infiltration with quarter percent bupivacaine without epinephrine was given 9 cc was used.  A sterile compressive dressing with fingers free was applied.  Deflation of the tourniquet all fingers immediately pink.  He was taken to the recovery room for observation in satisfactory condition.  He will be discharged home to return to the [facility] in 1 week Tylenol ibuprofen for pain with Ultram for breakthrough.   Cindee Salt, MD Electronically signed, 04/18/21

## 2021-04-18 NOTE — Transfer of Care (Signed)
Immediate Anesthesia Transfer of Care Note  Patient: Parker Morgan  Procedure(s) Performed: CARPAL TUNNEL RELEASE (Left: Hand)  Patient Location: PACU  Anesthesia Type:MAC and Bier block  Level of Consciousness: drowsy and patient cooperative  Airway & Oxygen Therapy: Patient Spontanous Breathing and Patient connected to face mask oxygen  Post-op Assessment: Report given to RN, Post -op Vital signs reviewed and stable and Patient moving all extremities X 4  Post vital signs: Reviewed and stable  Last Vitals:  Vitals Value Taken Time  BP 108/65 04/18/21 1133  Temp    Pulse 71 04/18/21 1134  Resp 18 04/18/21 1134  SpO2 94 % 04/18/21 1134  Vitals shown include unvalidated device data.  Last Pain:  Vitals:   04/18/21 0958  TempSrc: Oral  PainSc: 0-No pain         Complications: No notable events documented.

## 2021-04-18 NOTE — Brief Op Note (Signed)
04/18/2021  11:34 AM  PATIENT:  Parker Morgan  65 y.o. male  PRE-OPERATIVE DIAGNOSIS:  LEFT CARPAL TUNNEL SYNDROME  POST-OPERATIVE DIAGNOSIS:  LEFT CARPAL TUNNEL SYNDROME  PROCEDURE:  Procedure(s): CARPAL TUNNEL RELEASE (Left)  SURGEON:  Surgeon(s) and Role:    * Cindee Salt, MD - Primary  PHYSICIAN ASSISTANT:   ASSISTANTS: none   ANESTHESIA:   local, regional, and IV sedation  EBL:  1 mL   BLOOD ADMINISTERED:none  DRAINS: none   LOCAL MEDICATIONS USED:  BUPIVICAINE   SPECIMEN:  No Specimen  DISPOSITION OF SPECIMEN:  N/A  COUNTS:  YES  TOURNIQUET:   Total Tourniquet Time Documented: Forearm (Left) - 29 minutes Total: Forearm (Left) - 29 minutes   DICTATION: .Reubin Milan Dictation  PLAN OF CARE: Discharge to home after PACU  PATIENT DISPOSITION:  PACU - hemodynamically stable.

## 2021-04-18 NOTE — Anesthesia Postprocedure Evaluation (Signed)
Anesthesia Post Note  Patient: Parker Morgan  Procedure(s) Performed: CARPAL TUNNEL RELEASE (Left: Hand)     Patient location during evaluation: PACU Anesthesia Type: Bier Block Level of consciousness: awake and alert Pain management: pain level controlled Vital Signs Assessment: post-procedure vital signs reviewed and stable Respiratory status: spontaneous breathing, nonlabored ventilation and respiratory function stable Cardiovascular status: blood pressure returned to baseline and stable Postop Assessment: no apparent nausea or vomiting Anesthetic complications: no   No notable events documented.  Last Vitals:  Vitals:   04/18/21 1200 04/18/21 1215  BP: 117/71 127/69  Pulse: 62 68  Resp: 13 16  Temp:  36.4 C  SpO2: 92% 95%    Last Pain:  Vitals:   04/18/21 1215  TempSrc:   PainSc: 0-No pain                 Lowella Curb

## 2021-04-24 ENCOUNTER — Encounter (HOSPITAL_BASED_OUTPATIENT_CLINIC_OR_DEPARTMENT_OTHER): Payer: Self-pay | Admitting: Orthopedic Surgery

## 2021-04-26 DIAGNOSIS — Z8739 Personal history of other diseases of the musculoskeletal system and connective tissue: Secondary | ICD-10-CM | POA: Diagnosis not present

## 2021-04-26 DIAGNOSIS — M25512 Pain in left shoulder: Secondary | ICD-10-CM | POA: Diagnosis not present

## 2021-04-26 DIAGNOSIS — L4059 Other psoriatic arthropathy: Secondary | ICD-10-CM | POA: Diagnosis not present

## 2021-04-26 DIAGNOSIS — M25511 Pain in right shoulder: Secondary | ICD-10-CM | POA: Diagnosis not present

## 2021-06-07 DIAGNOSIS — Z8739 Personal history of other diseases of the musculoskeletal system and connective tissue: Secondary | ICD-10-CM | POA: Diagnosis not present

## 2021-06-07 DIAGNOSIS — L4059 Other psoriatic arthropathy: Secondary | ICD-10-CM | POA: Diagnosis not present

## 2021-06-07 DIAGNOSIS — M25512 Pain in left shoulder: Secondary | ICD-10-CM | POA: Diagnosis not present

## 2021-06-07 DIAGNOSIS — M25511 Pain in right shoulder: Secondary | ICD-10-CM | POA: Diagnosis not present

## 2021-06-20 DIAGNOSIS — L4059 Other psoriatic arthropathy: Secondary | ICD-10-CM | POA: Diagnosis not present

## 2021-06-26 DIAGNOSIS — U071 COVID-19: Secondary | ICD-10-CM | POA: Diagnosis not present

## 2021-07-11 DIAGNOSIS — L4059 Other psoriatic arthropathy: Secondary | ICD-10-CM | POA: Diagnosis not present

## 2021-07-11 DIAGNOSIS — M25512 Pain in left shoulder: Secondary | ICD-10-CM | POA: Diagnosis not present

## 2021-07-11 DIAGNOSIS — M25511 Pain in right shoulder: Secondary | ICD-10-CM | POA: Diagnosis not present

## 2021-07-11 DIAGNOSIS — Z8739 Personal history of other diseases of the musculoskeletal system and connective tissue: Secondary | ICD-10-CM | POA: Diagnosis not present

## 2021-07-12 DIAGNOSIS — M792 Neuralgia and neuritis, unspecified: Secondary | ICD-10-CM | POA: Diagnosis not present

## 2021-07-12 DIAGNOSIS — I82562 Chronic embolism and thrombosis of left calf muscular vein: Secondary | ICD-10-CM | POA: Diagnosis not present

## 2021-07-12 DIAGNOSIS — I2693 Single subsegmental pulmonary embolism without acute cor pulmonale: Secondary | ICD-10-CM | POA: Diagnosis not present

## 2021-08-31 IMAGING — DX DG CHEST 2V
2 series · 2 of 2 positions shown · non-contrast
Comparison: None.

CLINICAL DATA: Preoperative chest x-ray.

EXAM:
CHEST - 2 VIEW

[chest pa]
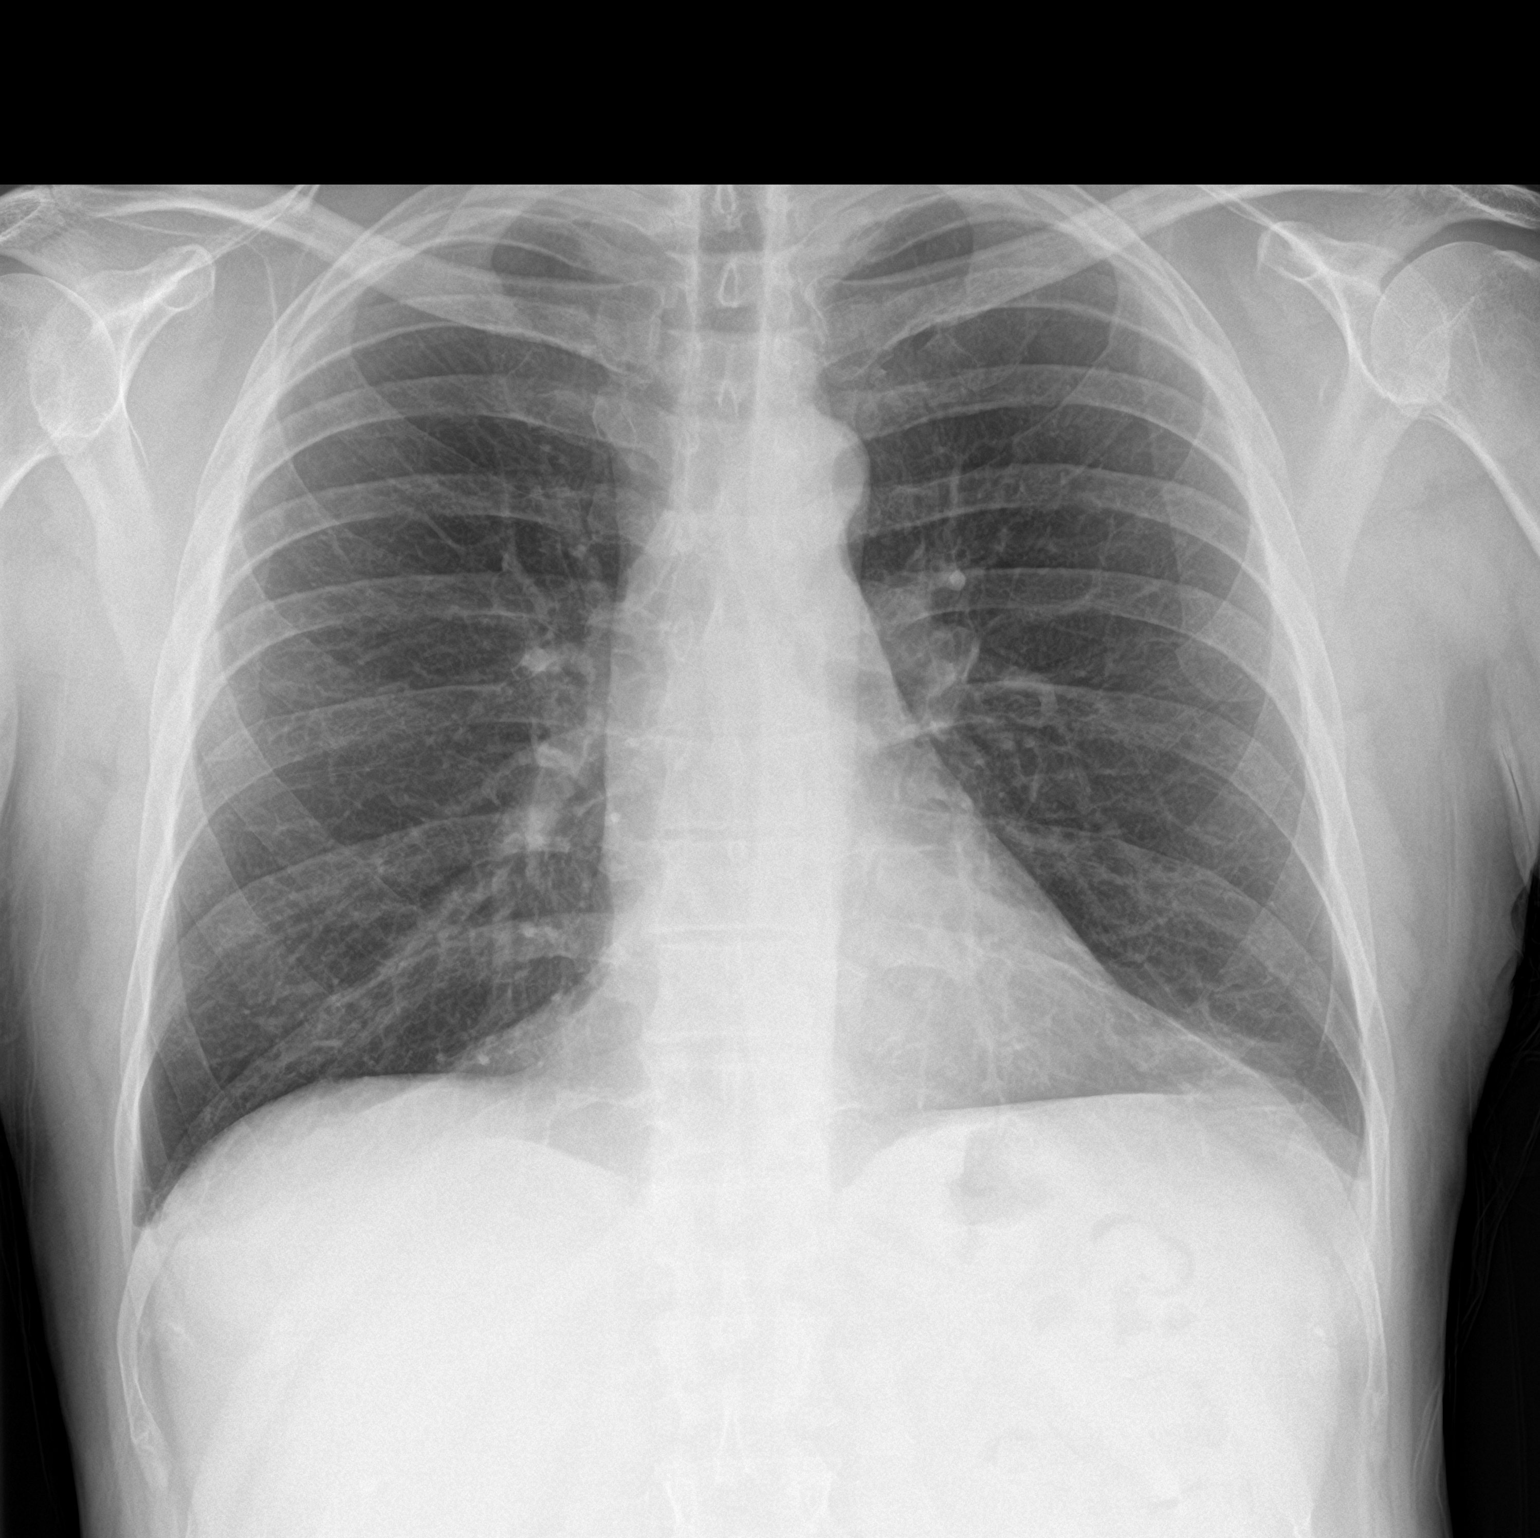

[chest lat]
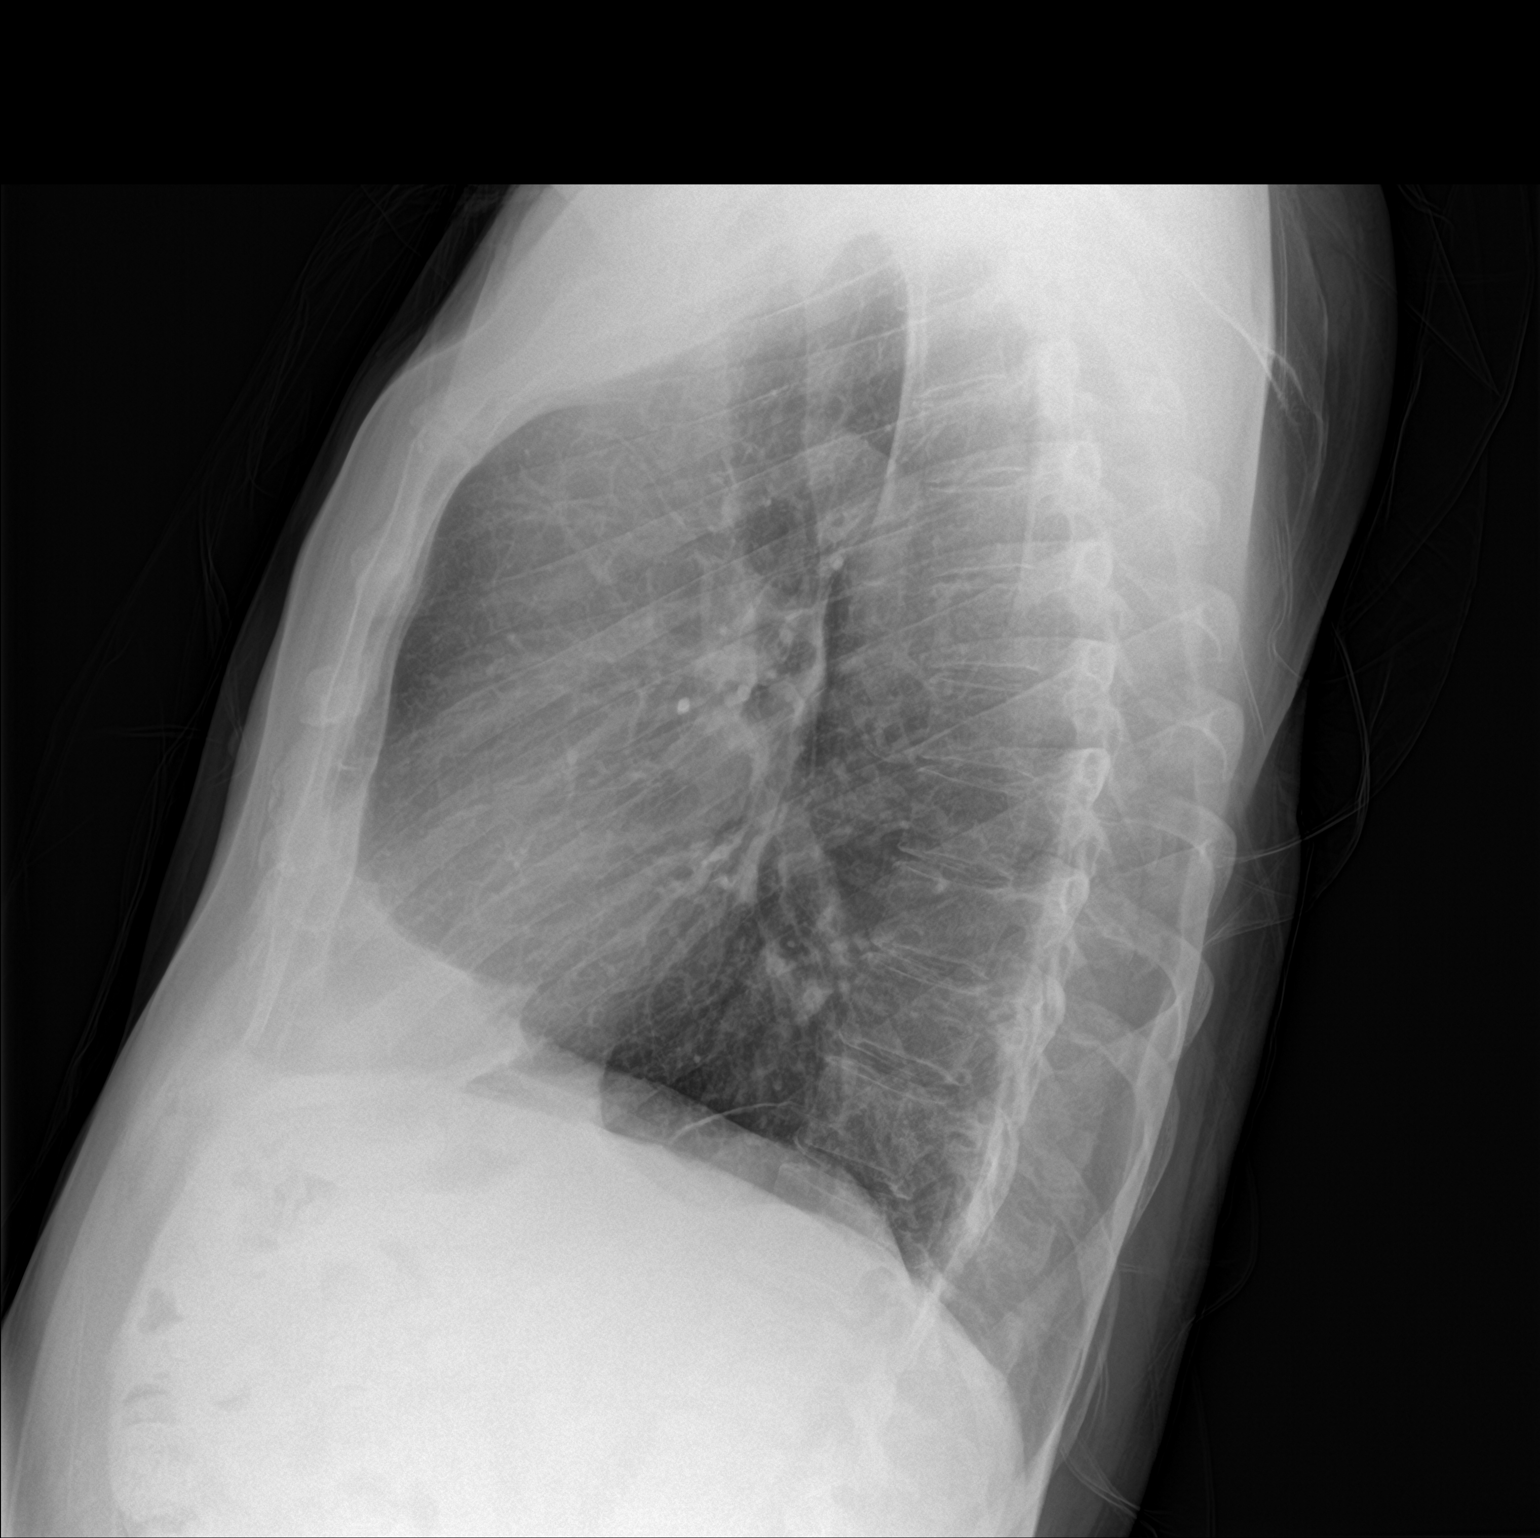

[2 of 2 positions shown; findings below may reference images not displayed]

FINDINGS: The heart size and mediastinal contours are within normal limits.
Both lungs are clear. The visualized skeletal structures are
unremarkable.
IMPRESSION: No active cardiopulmonary disease.

## 2021-09-11 DIAGNOSIS — R208 Other disturbances of skin sensation: Secondary | ICD-10-CM | POA: Diagnosis not present

## 2021-09-11 DIAGNOSIS — M79662 Pain in left lower leg: Secondary | ICD-10-CM | POA: Diagnosis not present

## 2021-09-11 DIAGNOSIS — M21372 Foot drop, left foot: Secondary | ICD-10-CM | POA: Diagnosis not present

## 2021-09-11 DIAGNOSIS — Z86718 Personal history of other venous thrombosis and embolism: Secondary | ICD-10-CM | POA: Diagnosis not present

## 2021-10-10 DIAGNOSIS — L409 Psoriasis, unspecified: Secondary | ICD-10-CM | POA: Diagnosis not present

## 2021-10-10 DIAGNOSIS — L4059 Other psoriatic arthropathy: Secondary | ICD-10-CM | POA: Diagnosis not present

## 2021-10-10 DIAGNOSIS — Z8739 Personal history of other diseases of the musculoskeletal system and connective tissue: Secondary | ICD-10-CM | POA: Diagnosis not present

## 2021-10-10 DIAGNOSIS — R5382 Chronic fatigue, unspecified: Secondary | ICD-10-CM | POA: Diagnosis not present

## 2021-12-05 DIAGNOSIS — Z8739 Personal history of other diseases of the musculoskeletal system and connective tissue: Secondary | ICD-10-CM | POA: Diagnosis not present

## 2021-12-05 DIAGNOSIS — R5382 Chronic fatigue, unspecified: Secondary | ICD-10-CM | POA: Diagnosis not present

## 2021-12-05 DIAGNOSIS — L409 Psoriasis, unspecified: Secondary | ICD-10-CM | POA: Diagnosis not present

## 2021-12-05 DIAGNOSIS — L4059 Other psoriatic arthropathy: Secondary | ICD-10-CM | POA: Diagnosis not present

## 2021-12-05 DIAGNOSIS — M7989 Other specified soft tissue disorders: Secondary | ICD-10-CM | POA: Diagnosis not present

## 2021-12-05 DIAGNOSIS — M25519 Pain in unspecified shoulder: Secondary | ICD-10-CM | POA: Diagnosis not present

## 2022-01-26 DIAGNOSIS — Z125 Encounter for screening for malignant neoplasm of prostate: Secondary | ICD-10-CM | POA: Diagnosis not present

## 2022-01-26 DIAGNOSIS — Z1329 Encounter for screening for other suspected endocrine disorder: Secondary | ICD-10-CM | POA: Diagnosis not present

## 2022-01-26 DIAGNOSIS — Z0000 Encounter for general adult medical examination without abnormal findings: Secondary | ICD-10-CM | POA: Diagnosis not present

## 2022-01-26 DIAGNOSIS — Z131 Encounter for screening for diabetes mellitus: Secondary | ICD-10-CM | POA: Diagnosis not present

## 2022-01-26 DIAGNOSIS — Z23 Encounter for immunization: Secondary | ICD-10-CM | POA: Diagnosis not present

## 2022-01-26 DIAGNOSIS — Z13220 Encounter for screening for lipoid disorders: Secondary | ICD-10-CM | POA: Diagnosis not present

## 2022-02-01 DIAGNOSIS — Z0000 Encounter for general adult medical examination without abnormal findings: Secondary | ICD-10-CM | POA: Diagnosis not present

## 2022-02-15 DIAGNOSIS — L409 Psoriasis, unspecified: Secondary | ICD-10-CM | POA: Diagnosis not present

## 2022-02-15 DIAGNOSIS — Z8739 Personal history of other diseases of the musculoskeletal system and connective tissue: Secondary | ICD-10-CM | POA: Diagnosis not present

## 2022-02-15 DIAGNOSIS — L4059 Other psoriatic arthropathy: Secondary | ICD-10-CM | POA: Diagnosis not present

## 2022-02-15 DIAGNOSIS — R5382 Chronic fatigue, unspecified: Secondary | ICD-10-CM | POA: Diagnosis not present

## 2022-03-06 DIAGNOSIS — R195 Other fecal abnormalities: Secondary | ICD-10-CM | POA: Diagnosis not present

## 2022-03-14 DIAGNOSIS — D125 Benign neoplasm of sigmoid colon: Secondary | ICD-10-CM | POA: Diagnosis not present

## 2022-03-14 DIAGNOSIS — R195 Other fecal abnormalities: Secondary | ICD-10-CM | POA: Diagnosis not present

## 2022-03-14 DIAGNOSIS — K5730 Diverticulosis of large intestine without perforation or abscess without bleeding: Secondary | ICD-10-CM | POA: Diagnosis not present

## 2022-03-14 DIAGNOSIS — K648 Other hemorrhoids: Secondary | ICD-10-CM | POA: Diagnosis not present

## 2022-05-16 DIAGNOSIS — F5101 Primary insomnia: Secondary | ICD-10-CM | POA: Diagnosis not present

## 2022-05-16 DIAGNOSIS — L4050 Arthropathic psoriasis, unspecified: Secondary | ICD-10-CM | POA: Diagnosis not present

## 2022-05-16 DIAGNOSIS — G629 Polyneuropathy, unspecified: Secondary | ICD-10-CM | POA: Diagnosis not present

## 2022-05-16 DIAGNOSIS — M353 Polymyalgia rheumatica: Secondary | ICD-10-CM | POA: Diagnosis not present

## 2022-05-21 DIAGNOSIS — R5382 Chronic fatigue, unspecified: Secondary | ICD-10-CM | POA: Diagnosis not present

## 2022-05-21 DIAGNOSIS — L4059 Other psoriatic arthropathy: Secondary | ICD-10-CM | POA: Diagnosis not present

## 2022-05-21 DIAGNOSIS — L409 Psoriasis, unspecified: Secondary | ICD-10-CM | POA: Diagnosis not present

## 2022-05-21 DIAGNOSIS — Z8739 Personal history of other diseases of the musculoskeletal system and connective tissue: Secondary | ICD-10-CM | POA: Diagnosis not present

## 2022-06-11 DIAGNOSIS — L4050 Arthropathic psoriasis, unspecified: Secondary | ICD-10-CM | POA: Diagnosis not present

## 2022-07-09 DIAGNOSIS — L4059 Other psoriatic arthropathy: Secondary | ICD-10-CM | POA: Diagnosis not present

## 2022-08-16 DIAGNOSIS — R109 Unspecified abdominal pain: Secondary | ICD-10-CM | POA: Diagnosis not present

## 2022-08-16 DIAGNOSIS — K219 Gastro-esophageal reflux disease without esophagitis: Secondary | ICD-10-CM | POA: Diagnosis not present

## 2022-08-16 DIAGNOSIS — F5101 Primary insomnia: Secondary | ICD-10-CM | POA: Diagnosis not present

## 2022-08-21 DIAGNOSIS — L4059 Other psoriatic arthropathy: Secondary | ICD-10-CM | POA: Diagnosis not present

## 2022-08-21 DIAGNOSIS — R5382 Chronic fatigue, unspecified: Secondary | ICD-10-CM | POA: Diagnosis not present

## 2022-08-21 DIAGNOSIS — L409 Psoriasis, unspecified: Secondary | ICD-10-CM | POA: Diagnosis not present

## 2022-08-21 DIAGNOSIS — Z8739 Personal history of other diseases of the musculoskeletal system and connective tissue: Secondary | ICD-10-CM | POA: Diagnosis not present

## 2022-08-31 DIAGNOSIS — K5730 Diverticulosis of large intestine without perforation or abscess without bleeding: Secondary | ICD-10-CM | POA: Diagnosis not present

## 2022-08-31 DIAGNOSIS — K760 Fatty (change of) liver, not elsewhere classified: Secondary | ICD-10-CM | POA: Diagnosis not present

## 2022-08-31 DIAGNOSIS — N200 Calculus of kidney: Secondary | ICD-10-CM | POA: Diagnosis not present

## 2022-08-31 DIAGNOSIS — K753 Granulomatous hepatitis, not elsewhere classified: Secondary | ICD-10-CM | POA: Diagnosis not present

## 2022-08-31 DIAGNOSIS — R1030 Lower abdominal pain, unspecified: Secondary | ICD-10-CM | POA: Diagnosis not present

## 2022-09-03 DIAGNOSIS — L4059 Other psoriatic arthropathy: Secondary | ICD-10-CM | POA: Diagnosis not present

## 2022-10-31 DIAGNOSIS — L4059 Other psoriatic arthropathy: Secondary | ICD-10-CM | POA: Diagnosis not present

## 2022-10-31 DIAGNOSIS — Z79899 Other long term (current) drug therapy: Secondary | ICD-10-CM | POA: Diagnosis not present

## 2022-11-20 DIAGNOSIS — Z8739 Personal history of other diseases of the musculoskeletal system and connective tissue: Secondary | ICD-10-CM | POA: Diagnosis not present

## 2022-11-20 DIAGNOSIS — R5382 Chronic fatigue, unspecified: Secondary | ICD-10-CM | POA: Diagnosis not present

## 2022-11-20 DIAGNOSIS — M79641 Pain in right hand: Secondary | ICD-10-CM | POA: Diagnosis not present

## 2022-11-20 DIAGNOSIS — L4059 Other psoriatic arthropathy: Secondary | ICD-10-CM | POA: Diagnosis not present

## 2022-11-20 DIAGNOSIS — L409 Psoriasis, unspecified: Secondary | ICD-10-CM | POA: Diagnosis not present

## 2022-11-20 DIAGNOSIS — M79642 Pain in left hand: Secondary | ICD-10-CM | POA: Diagnosis not present

## 2022-12-06 DIAGNOSIS — L4059 Other psoriatic arthropathy: Secondary | ICD-10-CM | POA: Diagnosis not present

## 2022-12-20 DIAGNOSIS — L4059 Other psoriatic arthropathy: Secondary | ICD-10-CM | POA: Diagnosis not present

## 2023-08-06 ENCOUNTER — Other Ambulatory Visit: Payer: Self-pay | Admitting: Family Medicine

## 2023-08-06 DIAGNOSIS — K7689 Other specified diseases of liver: Secondary | ICD-10-CM

## 2023-08-08 ENCOUNTER — Ambulatory Visit
Admission: RE | Admit: 2023-08-08 | Discharge: 2023-08-08 | Disposition: A | Discharge status: Home / Self Care | Payer: Medicare Other | Source: Ambulatory Visit | Attending: Family Medicine | Admitting: Family Medicine

## 2023-08-08 DIAGNOSIS — K7689 Other specified diseases of liver: Secondary | ICD-10-CM
